# Patient Record
Sex: Female | Born: 1950 | Race: White | Hispanic: No | Marital: Married | Smoking: Never smoker
Health system: Southern US, Community
[De-identification: ages and names within clinical notes are randomized; demographics above are authoritative.]

## PROBLEM LIST (undated history)

## (undated) DIAGNOSIS — F329 Major depressive disorder, single episode, unspecified: Secondary | ICD-10-CM

## (undated) DIAGNOSIS — N904 Leukoplakia of vulva: Secondary | ICD-10-CM

## (undated) DIAGNOSIS — F0390 Unspecified dementia without behavioral disturbance: Secondary | ICD-10-CM

## (undated) DIAGNOSIS — F0283 Dementia in other diseases classified elsewhere, unspecified severity, with mood disturbance: Secondary | ICD-10-CM

## (undated) DIAGNOSIS — F0280 Dementia in other diseases classified elsewhere without behavioral disturbance: Secondary | ICD-10-CM

## (undated) DIAGNOSIS — G309 Alzheimer's disease, unspecified: Secondary | ICD-10-CM

## (undated) DIAGNOSIS — F32A Depression, unspecified: Secondary | ICD-10-CM

## (undated) DIAGNOSIS — G300 Alzheimer's disease with early onset: Secondary | ICD-10-CM

## (undated) DIAGNOSIS — E785 Hyperlipidemia, unspecified: Secondary | ICD-10-CM

## (undated) HISTORY — DX: Dementia in other diseases classified elsewhere without behavioral disturbance: F02.80

## (undated) HISTORY — DX: Depression, unspecified: F32.A

## (undated) HISTORY — DX: Leukoplakia of vulva: N90.4

## (undated) HISTORY — DX: Dementia in other diseases classified elsewhere, unspecified severity, with mood disturbance: F02.83

## (undated) HISTORY — DX: Hyperlipidemia, unspecified: E78.5

## (undated) HISTORY — DX: Alzheimer's disease with early onset: G30.0

## (undated) HISTORY — DX: Major depressive disorder, single episode, unspecified: F32.9

---

## 1996-02-11 HISTORY — PX: SALPINGECTOMY: SHX328

## 1996-02-11 HISTORY — PX: ABDOMINAL HYSTERECTOMY: SHX81

## 1999-05-09 ENCOUNTER — Other Ambulatory Visit: Admission: RE | Admit: 1999-05-09 | Discharge: 1999-05-09 | Payer: Self-pay | Admitting: *Deleted

## 2000-08-24 ENCOUNTER — Ambulatory Visit (HOSPITAL_COMMUNITY): Admission: RE | Admit: 2000-08-24 | Discharge: 2000-08-24 | Payer: Self-pay | Admitting: Gastroenterology

## 2000-09-09 LAB — HM COLONOSCOPY: HM Colonoscopy: NORMAL

## 2001-03-30 ENCOUNTER — Encounter: Payer: Self-pay | Admitting: Family Medicine

## 2001-03-30 ENCOUNTER — Encounter: Admission: RE | Admit: 2001-03-30 | Discharge: 2001-03-30 | Payer: Self-pay | Admitting: Family Medicine

## 2003-06-12 ENCOUNTER — Ambulatory Visit (HOSPITAL_COMMUNITY): Admission: RE | Admit: 2003-06-12 | Discharge: 2003-06-12 | Payer: Self-pay | Admitting: Family Medicine

## 2004-08-28 ENCOUNTER — Ambulatory Visit (HOSPITAL_BASED_OUTPATIENT_CLINIC_OR_DEPARTMENT_OTHER): Admission: RE | Admit: 2004-08-28 | Discharge: 2004-08-28 | Payer: Self-pay | Admitting: Family Medicine

## 2004-09-01 ENCOUNTER — Ambulatory Visit: Payer: Self-pay | Admitting: Internal Medicine

## 2006-08-28 ENCOUNTER — Ambulatory Visit: Payer: Self-pay | Admitting: Internal Medicine

## 2006-12-26 ENCOUNTER — Ambulatory Visit: Payer: Self-pay | Admitting: Ophthalmology

## 2008-02-11 HISTORY — PX: CYSTOCELE REPAIR: SHX163

## 2009-05-23 ENCOUNTER — Ambulatory Visit: Payer: Self-pay | Admitting: Internal Medicine

## 2010-08-07 ENCOUNTER — Ambulatory Visit: Payer: Self-pay | Admitting: Internal Medicine

## 2011-01-10 ENCOUNTER — Telehealth: Payer: Self-pay | Admitting: Internal Medicine

## 2011-01-10 MED ORDER — SUMATRIPTAN SUCCINATE 50 MG PO TABS: 50.0000 mg | ORAL_TABLET | Freq: Once | ORAL | Status: DC | PRN

## 2011-01-10 NOTE — Telephone Encounter (Signed)
Refill e-prescribed

## 2011-01-21 ENCOUNTER — Ambulatory Visit (INDEPENDENT_AMBULATORY_CARE_PROVIDER_SITE_OTHER): Payer: Self-pay | Admitting: Internal Medicine

## 2011-01-21 ENCOUNTER — Encounter: Payer: Self-pay | Admitting: Internal Medicine

## 2011-01-21 DIAGNOSIS — R5383 Other fatigue: Secondary | ICD-10-CM

## 2011-01-21 DIAGNOSIS — F0280 Dementia in other diseases classified elsewhere without behavioral disturbance: Secondary | ICD-10-CM

## 2011-01-21 DIAGNOSIS — IMO0001 Reserved for inherently not codable concepts without codable children: Secondary | ICD-10-CM

## 2011-01-21 DIAGNOSIS — Z79899 Other long term (current) drug therapy: Secondary | ICD-10-CM

## 2011-01-21 DIAGNOSIS — E785 Hyperlipidemia, unspecified: Secondary | ICD-10-CM

## 2011-01-21 DIAGNOSIS — R350 Frequency of micturition: Secondary | ICD-10-CM

## 2011-01-21 DIAGNOSIS — R5381 Other malaise: Secondary | ICD-10-CM

## 2011-01-21 DIAGNOSIS — G43909 Migraine, unspecified, not intractable, without status migrainosus: Secondary | ICD-10-CM

## 2011-01-21 DIAGNOSIS — F329 Major depressive disorder, single episode, unspecified: Secondary | ICD-10-CM

## 2011-01-21 DIAGNOSIS — F0390 Unspecified dementia without behavioral disturbance: Secondary | ICD-10-CM

## 2011-01-21 LAB — POCT URINALYSIS DIPSTICK
Glucose, UA: NEGATIVE
Nitrite, UA: NEGATIVE
Urobilinogen, UA: 0.2

## 2011-01-21 MED ORDER — SERTRALINE HCL 100 MG PO TABS: 100.0000 mg | ORAL_TABLET | Freq: Every day | ORAL | Status: DC

## 2011-01-22 ENCOUNTER — Encounter: Payer: Self-pay | Admitting: Internal Medicine

## 2011-01-22 DIAGNOSIS — F329 Major depressive disorder, single episode, unspecified: Secondary | ICD-10-CM | POA: Insufficient documentation

## 2011-01-22 DIAGNOSIS — E785 Hyperlipidemia, unspecified: Secondary | ICD-10-CM | POA: Insufficient documentation

## 2011-01-22 DIAGNOSIS — G43909 Migraine, unspecified, not intractable, without status migrainosus: Secondary | ICD-10-CM | POA: Insufficient documentation

## 2011-01-22 LAB — COMPREHENSIVE METABOLIC PANEL
ALT: 32 U/L (ref 0–35)
CO2: 26 mEq/L (ref 19–32)
Creatinine, Ser: 0.7 mg/dL (ref 0.4–1.2)
GFR: 96.01 mL/min (ref >60.00)
Total Bilirubin: 0.5 mg/dL (ref 0.3–1.2)

## 2011-01-22 LAB — TSH: TSH: 1.05 u[IU]/mL (ref 0.35–5.50)

## 2011-01-22 NOTE — Assessment & Plan Note (Signed)
Recent recurrence of weekly headaches  Reported, although mild.  Will increase sertraline dose to 100 mg for prevention since historically her headaches improved with SSRI therapy. Continue sumatriptan for management of acute headaches.

## 2011-01-22 NOTE — Assessment & Plan Note (Signed)
With initial diagnosis confirmed by Shannan Harper at [facility].  Toady's MMSE was notable for losses in recall at 1 minute, abstract deficits including clock drawing and picture drawing.  Per husband, she is not allowed to cook or manage any finances at home .  He has hired a Community education officer several days per week to spend 2-3 hours with her during the day.

## 2011-01-22 NOTE — Progress Notes (Signed)
Subjective:    Patient ID: Lorraine Andrade, female    DOB: 05/11/1950, 60 y.o.   MRN: [id number]  HPI  Ms . 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  Her husband Doreatha Martin has taken over all of the financial matters she she was previously able to handle.  Has noted some urinary incontinence of the urge type.  No behavioral disturbances but increasing congfion has led him to relieve her of any cooking as well.  She remains able to drive to the grocery store and has had no episodes of getting lost or of traffic accidents. Recent recurrence of migraine headaches over the past 2 months also reported.   Past Medical History  Diagnosis Date  . Hyperlipidemia   . Depression   . Migraine   . Dementia of Alzheimer's type, with early onset, with depressed mood    Current Outpatient Prescriptions on File Prior to Visit  Medication Sig Dispense Refill  . SUMAtriptan (IMITREX) 50 MG tablet Take 1 tablet (50 mg total) by mouth once as needed for migraine.  30 tablet  2    Review of Systems  Constitutional: Negative for fever, chills and unexpected weight change.  HENT: Negative for hearing loss, ear pain, nosebleeds, congestion, sore throat, facial swelling, rhinorrhea, sneezing, mouth sores, trouble swallowing, neck pain, neck stiffness, voice change, postnasal drip, sinus pressure, tinnitus and ear discharge.   Eyes: Negative for pain, discharge, redness and visual disturbance.  Respiratory: Negative for cough, chest tightness, shortness of breath, wheezing and stridor.   Cardiovascular: Negative for chest pain, palpitations and leg swelling.  Genitourinary: Positive for urgency.  Musculoskeletal: Negative for myalgias and arthralgias.  Skin: Negative for color change and rash.  Neurological: Positive for headaches. Negative for dizziness, weakness and light-headedness.  Hematological: Negative for adenopathy.   Psychiatric/Behavioral: Positive for confusion.   BP 130/72  Pulse 76  Temp(Src) 98.1 F (36.7 C) (Oral)  Resp 16  Ht 5\' 5"  (1.651 m)  Wt 167 lb 8 oz (75.978 kg)  BMI 27.87 kg/m2  SpO2 96%     Objective:   Physical Exam  Constitutional: She is oriented to person, place, and time. She appears well-developed and well-nourished.  HENT:  Mouth/Throat: Oropharynx is clear and moist.  Eyes: EOM are normal. Pupils are equal, round, and reactive to light. No scleral icterus.  Neck: Normal range of motion. Neck supple. No JVD present. No thyromegaly present.  Cardiovascular: Normal rate, regular rhythm, normal heart sounds and intact distal pulses.   Pulmonary/Chest: Effort normal and breath sounds normal.  Abdominal: Soft. Bowel sounds are normal. She exhibits no mass. There is no tenderness.  Musculoskeletal: Normal range of motion. She exhibits no edema.  Lymphadenopathy:    She has no cervical adenopathy.  Neurological: She is alert and oriented to person, place, and time.  Skin: Skin is warm and dry.  Psychiatric: She has a normal mood and affect.          Assessment & Plan:   Dementia of Alzheimer's type, with early onset, with depressed mood With initial diagnosis confirmed by Shannan Harper at [facility].  Toady's MMSE was notable for losses in recall at 1 minute, abstract deficits including clock drawing and picture drawing.  Per husband, she is not allowed to cook or manage any finances at home .  He has hired a Community education officer several days per week to spend 2-3  hours with her during the day.  Hyperlipidemia LDL goal <100 Managed with simvastatin, last evaluated one year ago.  She is overdue  for fasting lipids and CMET adn advised husband to resume q 6 months hepatic panel testing.   Migraine headache Recent recurrence of weekly headaches  Reported, although mild.  Will increase sertraline dose to 100 mg for prevention since historically her headaches improved with SSRI  therapy. Continue sumatriptan for management of acute headaches.     Updated Medication List Outpatient Encounter Prescriptions as of 01/21/2011  Medication Sig Dispense Refill  . sertraline (ZOLOFT) 100 MG tablet Take 1 tablet (100 mg total) by mouth daily.  90 tablet  3  . SUMAtriptan (IMITREX) 50 MG tablet Take 1 tablet (50 mg total) by mouth once as needed for migraine.  30 tablet  2  . DISCONTD: sertraline (ZOLOFT) 50 MG tablet Take 50 mg by mouth daily.        Marland Kitchen DISCONTD: ALPRAZolam (XANAX) 0.5 MG tablet Take 0.5 mg by mouth at bedtime as needed.        Marland Kitchen DISCONTD: SERTRALINE HCL PO Take by mouth.        . DISCONTD: simvastatin (ZOCOR) 40 MG tablet Take 40 mg by mouth at bedtime.

## 2011-01-22 NOTE — Assessment & Plan Note (Addendum)
Managed with simvastatin, last evaluated one year ago.  She is overdue  for fasting lipids and CMET adn advised husband to resume q 6 months hepatic panel testing.

## 2011-01-23 LAB — CULTURE, URINE COMPREHENSIVE
Colony Count: NO GROWTH
Organism ID, Bacteria: NO GROWTH

## 2011-02-10 ENCOUNTER — Other Ambulatory Visit: Payer: Self-pay | Admitting: Internal Medicine

## 2011-04-15 ENCOUNTER — Encounter: Payer: Self-pay | Admitting: Internal Medicine

## 2011-05-19 ENCOUNTER — Encounter: Payer: Self-pay | Admitting: Internal Medicine

## 2011-05-19 ENCOUNTER — Ambulatory Visit (INDEPENDENT_AMBULATORY_CARE_PROVIDER_SITE_OTHER): Payer: No Typology Code available for payment source | Admitting: Internal Medicine

## 2011-05-19 VITALS — BP 128/76 | HR 74 | Temp 98.6°F | Resp 16 | Ht 68.0 in | Wt 168.0 lb

## 2011-05-19 DIAGNOSIS — E785 Hyperlipidemia, unspecified: Secondary | ICD-10-CM

## 2011-05-19 DIAGNOSIS — Z13220 Encounter for screening for lipoid disorders: Secondary | ICD-10-CM

## 2011-05-19 DIAGNOSIS — R5381 Other malaise: Secondary | ICD-10-CM

## 2011-05-19 DIAGNOSIS — F0280 Dementia in other diseases classified elsewhere without behavioral disturbance: Secondary | ICD-10-CM

## 2011-05-19 DIAGNOSIS — Z1211 Encounter for screening for malignant neoplasm of colon: Secondary | ICD-10-CM | POA: Insufficient documentation

## 2011-05-19 DIAGNOSIS — Z0000 Encounter for general adult medical examination without abnormal findings: Secondary | ICD-10-CM

## 2011-05-19 DIAGNOSIS — R5383 Other fatigue: Secondary | ICD-10-CM

## 2011-05-19 DIAGNOSIS — F329 Major depressive disorder, single episode, unspecified: Secondary | ICD-10-CM

## 2011-05-19 DIAGNOSIS — Z1239 Encounter for other screening for malignant neoplasm of breast: Secondary | ICD-10-CM

## 2011-05-19 NOTE — Patient Instructions (Signed)
Your exam was normal.  We will get your mamogram set up .  Return for fasting labs when it is convenient. For you to do so.

## 2011-05-19 NOTE — Assessment & Plan Note (Signed)
Progressive,  Remains verbally communicative,  But all executive tasks have been taken over by husband and caregiver.

## 2011-05-19 NOTE — Progress Notes (Addendum)
Patient ID: Lorraine Andrade, female   DOB: January 25, 1951, 61 y.o.   MRN: [id number]   Patient Active Problem List  Diagnoses  . Dementia of Alzheimer's type, with early onset, with depressed mood  . Hyperlipidemia LDL goal <100  . Migraine headache  . Annual physical exam  . Screening for colon cancer    Subjective:  CC:   Chief Complaint  Patient presents with  . Annual Exam    HPI:   Lorraine Andrade a 61 y.o. female who presents For her annual exam.  She has a history of early progressive dementia, hyperlipidemia and lichen sclerosis et atrophicus.  History of cystocele repaired several years ago at [facility].  continues to have intermittent mild stress urinary incontinence, managed with use of a pad.  Sleeping well, no joint pains. Remains active at home but all executive functions are now managed by husband and caregiver.  S/p hysterectomy years ago for noncancerous reasons.    Past Medical History  Diagnosis Date  . Hyperlipidemia   . Depression   . Migraine   . Dementia of Alzheimer's type, with early onset, with depressed mood     Past Surgical History  Procedure Date  . Salpingectomy 1998    bilateral  . Abdominal hysterectomy 1998  . Cystocele repair 2010    [facility]         The following portions of the patient's history were reviewed and updated as appropriate: Allergies, current medications, and problem list.    Review of Systems:   12 Pt  review of systems was negative except those addressed in the HPI,     History   Social History  . Marital Status: Married    Spouse Name: N/A    Number of Children: N/A  . Years of Education: N/A   Occupational History  . Not on file.   Social History Main Topics  . Smoking status: Never Smoker   . Smokeless tobacco: Never Used  . Alcohol Use: No  . Drug Use: No  . Sexually Active: Not on file   Other Topics Concern  . Not on file   Social History Narrative  . No narrative on file     Objective:  BP 128/76  Pulse 74  Temp(Src) 98.6 F (37 C) (Oral)  Resp 16  Ht 5\' 8"  (1.727 m)  Wt 168 lb (76.204 kg)  BMI 25.54 kg/m2  SpO2 98%  General appearance: alert, cooperative and appears stated age Ears: normal TM's and external ear canals both ears Throat: lips, mucosa, and tongue normal; teeth and gums normal Neck: no adenopathy, no carotid bruit, supple, symmetrical, trachea midline and thyroid not enlarged, symmetric, no tenderness/mass/nodules Back: symmetric, no curvature. ROM normal. No CVA tenderness. Lungs: clear to auscultation bilaterally Heart: regular rate and rhythm, S1, S2 normal, no murmur, click, rub or gallop Abdomen: soft, non-tender; bowel sounds normal; no masses,  no organomegaly Pulses: 2+ and symmetric Skin: Skin color, texture, turgor normal. No rashes or lesions Lymph nodes: Cervical, supraclavicular, and axillary nodes normal.  Assessment and Plan:  Screening for colon cancer Her last colonoscopy was done by [facility], 2003.  Diverticulosis reported. Will repeat with [facility] unless patient has preference.   Dementia of Alzheimer's type, with early onset, with depressed mood Progressive,  Remains verbally communicative,  But all executive tasks have been taken over by husband and caregiver.   Hyperlipidemia LDL goal <100 Managed with low dose pravastatin,  Due for repeat lipids and  CMET   Annual physical exam Breast and pelvic done today.  Mammogram ordered. She is s/p hysterectomy and repair of cystocele.     Updated Medication List Outpatient Encounter Prescriptions as of 05/19/2011  Medication Sig Dispense Refill  . sertraline (ZOLOFT) 100 MG tablet Take 1 tablet (100 mg total) by mouth daily.  90 tablet  3  . simvastatin (ZOCOR) 40 MG tablet Take 40 mg by mouth every evening.      . SUMAtriptan (IMITREX) 50 MG tablet Take 1 tablet (50 mg total) by mouth once as needed for migraine.  30 tablet  2     Orders Placed This  Encounter  Procedures  . MM Digital Screening  . TSH  . Lipid panel  . COMPLETE METABOLIC PANEL WITH GFR    No Follow-up on file.

## 2011-05-19 NOTE — Assessment & Plan Note (Signed)
Breast and pelvic done today.  Mammogram ordered. She is s/p hysterectomy and repair of cystocele.

## 2011-05-19 NOTE — Assessment & Plan Note (Signed)
Managed with low dose pravastatin,  Due for repeat lipids and CMET

## 2011-05-19 NOTE — Assessment & Plan Note (Addendum)
Her last colonoscopy was done by [facility], 2003.  Diverticulosis reported. Will repeat with  GI unless patient has preference.

## 2011-07-11 ENCOUNTER — Encounter: Payer: Self-pay | Admitting: Internal Medicine

## 2011-07-11 ENCOUNTER — Ambulatory Visit (INDEPENDENT_AMBULATORY_CARE_PROVIDER_SITE_OTHER): Payer: No Typology Code available for payment source | Admitting: Internal Medicine

## 2011-07-11 VITALS — BP 118/70 | HR 67 | Temp 98.1°F | Resp 16 | Wt 168.0 lb

## 2011-07-11 DIAGNOSIS — R5381 Other malaise: Secondary | ICD-10-CM

## 2011-07-11 DIAGNOSIS — R102 Pelvic and perineal pain: Secondary | ICD-10-CM

## 2011-07-11 DIAGNOSIS — Z13220 Encounter for screening for lipoid disorders: Secondary | ICD-10-CM

## 2011-07-11 DIAGNOSIS — Z1239 Encounter for other screening for malignant neoplasm of breast: Secondary | ICD-10-CM

## 2011-07-11 DIAGNOSIS — E785 Hyperlipidemia, unspecified: Secondary | ICD-10-CM

## 2011-07-11 DIAGNOSIS — R5383 Other fatigue: Secondary | ICD-10-CM

## 2011-07-11 DIAGNOSIS — N949 Unspecified condition associated with female genital organs and menstrual cycle: Secondary | ICD-10-CM

## 2011-07-11 DIAGNOSIS — Z1211 Encounter for screening for malignant neoplasm of colon: Secondary | ICD-10-CM

## 2011-07-11 LAB — COMPREHENSIVE METABOLIC PANEL
ALT: 23 U/L (ref 0–35)
AST: 21 U/L (ref 0–37)
Alkaline Phosphatase: 66 U/L (ref 39–117)
Calcium: 9.1 mg/dL (ref 8.4–10.5)
Chloride: 105 mEq/L (ref 96–112)
Creatinine, Ser: 0.7 mg/dL (ref 0.4–1.2)

## 2011-07-11 LAB — TSH: TSH: 0.79 u[IU]/mL (ref 0.35–5.50)

## 2011-07-11 LAB — POCT URINALYSIS DIPSTICK
Bilirubin, UA: NEGATIVE
Glucose, UA: NEGATIVE
Ketones, UA: NEGATIVE
Nitrite, UA: NEGATIVE
Protein, UA: NEGATIVE
Spec Grav, UA: 1.025
Urobilinogen, UA: 0.2
pH, UA: 7

## 2011-07-11 LAB — LIPID PANEL
LDL Cholesterol: 94 mg/dL (ref 0–99)
Total CHOL/HDL Ratio: 4
Triglycerides: 169 mg/dL — ABNORMAL HIGH (ref 0.0–149.0)
VLDL: 33.8 mg/dL (ref 0.0–40.0)

## 2011-07-11 MED ORDER — CLOBETASOL PROPIONATE 0.05 % EX OINT: 1.0000 "application " | TOPICAL_OINTMENT | Freq: Two times a day (BID) | CUTANEOUS | Status: DC

## 2011-07-11 NOTE — Progress Notes (Addendum)
Patient ID: Lorraine Andrade, female   DOB: [birth year], 61 y.o.   MRN: [id number]  Patient Active Problem List  Diagnoses  . Dementia of Alzheimer's type, with early onset, with depressed mood  . Hyperlipidemia LDL goal <100  . Migraine headache  . Annual physical exam  . Screening for colon cancer  . Suprapubic pain, acute    Subjective:  CC:   Chief Complaint  Patient presents with  . Pelvic Pain    HPI:   Lorraine Hays Mooreis a 61 y.o. female who presents after a recent episode of severe lower abdominal pain accompanied by mild frequency, occurred while vacationing at their home in [place] [facility].  No diarrhea or hematuria experienced.  Because of the dementia,  Etiology (bowel vs bladder ) was unclear.  Took tylenol and suppisotory for hemorrhoids and after a few days felt better.    Past Medical History  Diagnosis Date  . Hyperlipidemia   . Depression   . Migraine   . Dementia of Alzheimer's type, with early onset, with depressed mood     Past Surgical History  Procedure Date  . Salpingectomy 1998    bilateral  . Abdominal hysterectomy 1998  . Cystocele repair 2010    [facility]         The following portions of the patient's history were reviewed and updated as appropriate: Allergies, current medications, and problem list.    Review of Systems:  Comprehensive review of systems was negative except those addressed in the HPI,     History   Social History  . Marital Status: Married    Spouse Name: N/A    Number of Children: N/A  . Years of Education: N/A   Occupational History  . Not on file.   Social History Main Topics  . Smoking status: Never Smoker   . Smokeless tobacco: Never Used  . Alcohol Use: No  . Drug Use: No  . Sexually Active: Not on file   Other Topics Concern  . Not on file   Social History Narrative  . No narrative on file    Objective:  BP 118/70  Pulse 67  Temp(Src) 98.1 F (36.7 C) (Oral)  Resp 16  Wt 168 lb  (76.204 kg)  SpO2 93%  General appearance: alert, cooperative and appears stated age Ears: normal TM's and external ear canals both ears Throat: lips, mucosa, and tongue normal; teeth and gums normal Neck: no adenopathy, no carotid bruit, supple, symmetrical, trachea midline and thyroid not enlarged, symmetric, no tenderness/mass/nodules Back: symmetric, no curvature. ROM normal. No CVA tenderness. Lungs: clear to auscultation bilaterally Heart: regular rate and rhythm, S1, S2 normal, no murmur, click, rub or gallop Abdomen: soft, non-tender; bowel sounds normal; no masses,  no organomegaly GYN: vaginal atrophy noted no discharge or irritation noted. adnexa nonpalpable Rectal: no masses, stool brown and heme negative.  Pulses: 2+ and symmetric Skin: Skin color, texture, turgor normal. No rashes or lesions Lymph nodes: Cervical, supraclavicular, and axillary nodes normal.  Assessment and Plan:  Suprapubic pain, acute Pelvic and rectal exam were done today with no evidence of abnormality on exam.  UA had trace amount of leukocytes,  Will treat based on culture results.   Hyperlipidemia LDL goal <100 LDl is 94 on current statin dose ,  And LFTs are normal.  No changes to regimen.     Updated Medication List Outpatient Encounter Prescriptions as of 07/11/2011  Medication Sig Dispense Refill  . clobetasol ointment (TEMOVATE) 0.05 %  Apply 1 application topically 2 (two) times daily.  30 g  5  . sertraline (ZOLOFT) 100 MG tablet Take 1 tablet (100 mg total) by mouth daily.  90 tablet  3  . simvastatin (ZOCOR) 40 MG tablet Take 40 mg by mouth every evening.      . SUMAtriptan (IMITREX) 50 MG tablet Take 1 tablet (50 mg total) by mouth once as needed for migraine.  30 tablet  2  . DISCONTD: clobetasol ointment (TEMOVATE) 0.05 % Apply 1 application topically 2 (two) times daily.         Orders Placed This Encounter  Procedures  . Urine Culture  . HM MAMMOGRAPHY  . MM Digital Screening    . TSH  . Lipid panel  . Comp Met (CMET)  . Ambulatory referral to Gastroenterology  . POCT Urinalysis Dipstick  . IFOBT POC (occult bld, rslt in office)  . HM COLONOSCOPY  . HM COLONOSCOPY    No Follow-up on file.

## 2011-07-13 DIAGNOSIS — R102 Pelvic and perineal pain: Secondary | ICD-10-CM | POA: Insufficient documentation

## 2011-07-13 NOTE — Assessment & Plan Note (Addendum)
Etiology unclear, Pelvic and rectal exam were done today with no evidence of abnormality on exam.  UA had trace amount of leukocytes,  Will treat based on culture results.

## 2011-07-13 NOTE — Assessment & Plan Note (Signed)
LDl is 94 on current statin dose ,  And LFTs are normal.  No changes to regimen.

## 2011-07-14 LAB — URINE CULTURE: Colony Count: 80000

## 2011-08-15 NOTE — Progress Notes (Signed)
Patient ID: Lorraine Andrade, female   DOB: 10-Apr-1950, 61 y.o.   MRN: [id number]

## 2011-08-27 ENCOUNTER — Ambulatory Visit: Payer: Self-pay | Admitting: Internal Medicine

## 2011-09-01 ENCOUNTER — Telehealth: Payer: Self-pay | Admitting: Internal Medicine

## 2011-09-01 ENCOUNTER — Other Ambulatory Visit: Payer: Self-pay | Admitting: *Deleted

## 2011-09-01 MED ORDER — SIMVASTATIN 40 MG PO TABS: 40.0000 mg | ORAL_TABLET | Freq: Every evening | ORAL | Status: DC

## 2011-09-01 NOTE — Telephone Encounter (Signed)
Patient notified

## 2011-09-01 NOTE — Telephone Encounter (Signed)
Her mammogram was normal.  Repeat in one year 

## 2011-09-02 ENCOUNTER — Other Ambulatory Visit: Payer: Self-pay | Admitting: Internal Medicine

## 2011-09-02 MED ORDER — SERTRALINE HCL 100 MG PO TABS: 100.0000 mg | ORAL_TABLET | Freq: Every day | ORAL | Status: DC

## 2011-11-26 ENCOUNTER — Encounter: Payer: Self-pay | Admitting: Internal Medicine

## 2011-11-26 ENCOUNTER — Ambulatory Visit (INDEPENDENT_AMBULATORY_CARE_PROVIDER_SITE_OTHER): Payer: Medicare Other | Admitting: Internal Medicine

## 2011-11-26 VITALS — BP 132/78 | HR 77 | Temp 98.2°F | Ht 68.0 in | Wt 174.8 lb

## 2011-11-26 DIAGNOSIS — N904 Leukoplakia of vulva: Secondary | ICD-10-CM

## 2011-11-26 DIAGNOSIS — Z23 Encounter for immunization: Secondary | ICD-10-CM

## 2011-11-26 DIAGNOSIS — L940 Localized scleroderma [morphea]: Secondary | ICD-10-CM

## 2011-11-26 DIAGNOSIS — R309 Painful micturition, unspecified: Secondary | ICD-10-CM

## 2011-11-26 DIAGNOSIS — N23 Unspecified renal colic: Secondary | ICD-10-CM

## 2011-11-26 LAB — POCT URINALYSIS DIPSTICK
Bilirubin, UA: NEGATIVE
Glucose, UA: NEGATIVE
Ketones, UA: NEGATIVE
Leukocytes, UA: NEGATIVE
Protein, UA: NEGATIVE
Spec Grav, UA: 1.025

## 2011-11-26 MED ORDER — MEMANTINE HCL ER 28 MG PO CP24: 1.0000 | ORAL_CAPSULE | Freq: Every day | ORAL | Status: DC

## 2011-11-26 NOTE — Patient Instructions (Signed)
Please use the clobetasol ointment EVERY NIGHT for 6 weeks.  Return in 6 weeks for a recheck to see if we can start tapering your dose.

## 2011-11-26 NOTE — Progress Notes (Signed)
Patient ID: Lorraine Andrade, female   DOB: 12/16/1950, 61 y.o.   MRN: [id number]  Patient Active Problem List  Diagnosis  . Dementia of Alzheimer's type, with early onset, with depressed mood  . Hyperlipidemia LDL goal <100  . Migraine headache  . Annual physical exam  . Screening for colon cancer  . Lichen sclerosus of female genitalia    Subjective:  CC:   Chief Complaint  Patient presents with  . Diaper Rash    HPI:   Lorraine Andrade a 61 y.o. female who presents Dysuria. Symptoms have been present for one week.  She denies back pain, uriary frequency.  She has a  History of lichen sclerosis et atrophicans but has not  been using her steroid ointment clobetasol proprionate in several months.      Past Medical History  Diagnosis Date  . Hyperlipidemia   . Depression   . Migraine   . Dementia of Alzheimer's type, with early onset, with depressed mood   . Lichen sclerosus of female genitalia     Past Surgical History  Procedure Date  . Salpingectomy 1998    bilateral  . Abdominal hysterectomy 1998  . Cystocele repair 2010    [facility]         The following portions of the patient's history were reviewed and updated as appropriate: Allergies, current medications, and problem list.    Review of Systems:   12 Pt  review of systems was negative except those addressed in the HPI,     History   Social History  . Marital Status: Married    Spouse Name: N/A    Number of Children: N/A  . Years of Education: N/A   Occupational History  . Not on file.   Social History Main Topics  . Smoking status: Never Smoker   . Smokeless tobacco: Never Used  . Alcohol Use: No  . Drug Use: No  . Sexually Active: Not on file   Other Topics Concern  . Not on file   Social History Narrative  . No narrative on file    Objective:  BP 132/78  Pulse 77  Temp 98.2 F (36.8 C) (Oral)  Ht 5\' 8"  (1.727 m)  Wt 174 lb 12 oz (79.266 kg)  BMI 26.57 kg/m2  SpO2  97%  General appearance: alert, cooperative and appears stated age Neck: no adenopathy, no carotid bruit, supple, symmetrical, trachea midline and thyroid not enlarged, symmetric, no tenderness/mass/nodules Back: symmetric, no curvature. ROM normal. No CVA tenderness. Lungs: clear to auscultation bilaterally Heart: regular rate and rhythm, S1, S2 normal, no murmur, click, rub or gallop Abdomen: soft, non-tender; bowel sounds normal; no masses,  no organomegaly GYN: vulva mildly erythematous, vaginal vulva without discharge or erythema  Pulses: 2+ and symmetric Skin: Skin color,  turgor normal. . No rashes or lesions. Texture extremely dry/xerotic Lymph nodes: Cervical, supraclavicular, and axillary nodes normal.  Assessment and Plan:  Lichen sclerosus of female genitalia With current recurrence of symptoms due to lapse in treatment.  Resume use of ointment nightly for 6 weeks, return for repeat evaluation.    Updated Medication List Outpatient Encounter Prescriptions as of 11/26/2011  Medication Sig Dispense Refill  . clobetasol ointment (TEMOVATE) 0.05 % Apply 1 application topically 2 (two) times daily.  30 g  5  . sertraline (ZOLOFT) 100 MG tablet Take 1 tablet (100 mg total) by mouth daily.  90 tablet  3  . simvastatin (ZOCOR) 40 MG tablet Take  1 tablet (40 mg total) by mouth every evening.  30 tablet  4  . DISCONTD: Memantine HCl ER (NAMENDA XR TITRATION PACK) 7 & 14 & 21 &28 MG CP24 Take by mouth.      . Memantine HCl ER (NAMENDA XR) 28 MG CP24 Take 1 tablet by mouth daily.  30 capsule  11  . SUMAtriptan (IMITREX) 50 MG tablet Take 1 tablet (50 mg total) by mouth once as needed for migraine.  30 tablet  2     Orders Placed This Encounter  Procedures  . Flu vaccine greater than or equal to 3yo preservative free IM  . POCT urinalysis dipstick    Return in about 6 weeks (around 01/07/2012).

## 2011-11-28 ENCOUNTER — Encounter: Payer: Self-pay | Admitting: Internal Medicine

## 2011-11-28 DIAGNOSIS — N904 Leukoplakia of vulva: Secondary | ICD-10-CM | POA: Insufficient documentation

## 2011-11-28 NOTE — Assessment & Plan Note (Signed)
With current recurrence of symptoms due to lapse in treatment.  Resume use of ointment nightly for 6 weeks, return for repeat evaluation.

## 2011-11-29 MED ORDER — CLOBETASOL PROPIONATE 0.05 % EX OINT: 1.0000 "application " | TOPICAL_OINTMENT | Freq: Every day | CUTANEOUS | Status: DC

## 2011-12-29 ENCOUNTER — Other Ambulatory Visit: Payer: Self-pay | Admitting: Internal Medicine

## 2011-12-29 DIAGNOSIS — F0280 Dementia in other diseases classified elsewhere without behavioral disturbance: Secondary | ICD-10-CM

## 2011-12-29 MED ORDER — RIVASTIGMINE 4.6 MG/24HR TD PT24: 1.0000 | MEDICATED_PATCH | Freq: Every day | TRANSDERMAL | Status: DC

## 2012-01-05 ENCOUNTER — Ambulatory Visit (INDEPENDENT_AMBULATORY_CARE_PROVIDER_SITE_OTHER): Payer: Medicare Other | Admitting: Internal Medicine

## 2012-01-05 ENCOUNTER — Encounter: Payer: Self-pay | Admitting: Internal Medicine

## 2012-01-05 VITALS — BP 126/70 | HR 73 | Temp 98.2°F | Resp 12 | Ht 68.0 in | Wt 177.0 lb

## 2012-01-05 DIAGNOSIS — L940 Localized scleroderma [morphea]: Secondary | ICD-10-CM

## 2012-01-05 DIAGNOSIS — Z1211 Encounter for screening for malignant neoplasm of colon: Secondary | ICD-10-CM

## 2012-01-05 DIAGNOSIS — E785 Hyperlipidemia, unspecified: Secondary | ICD-10-CM

## 2012-01-05 DIAGNOSIS — F329 Major depressive disorder, single episode, unspecified: Secondary | ICD-10-CM

## 2012-01-05 DIAGNOSIS — F0280 Dementia in other diseases classified elsewhere without behavioral disturbance: Secondary | ICD-10-CM

## 2012-01-05 DIAGNOSIS — N904 Leukoplakia of vulva: Secondary | ICD-10-CM

## 2012-01-05 NOTE — Patient Instructions (Addendum)
Continue to use the cream 3 times weekly (every other night)   You need to drink at least 3 glasses of water (flavored ok) daily

## 2012-01-06 ENCOUNTER — Encounter: Payer: Self-pay | Admitting: Internal Medicine

## 2012-01-06 NOTE — Assessment & Plan Note (Signed)
Symptoms are now resolved with daily use of clobetasol ointment. Caregiver has been instructed to reduce use 2-3 times weekly now for S. maintenance

## 2012-01-06 NOTE — Progress Notes (Signed)
Patient ID: Lorraine Andrade, female   DOB: 12-01-1950, 61 y.o.   MRN: [id number]  Patient Active Problem List  Diagnosis  . Dementia of Alzheimer's type, with early onset, with depressed mood  . Hyperlipidemia LDL goal <100  . Migraine headache  . Annual physical exam  . Screening for colon cancer  . Lichen sclerosus of female genitalia    Subjective:  CC:   Chief Complaint  Patient presents with  . Follow-up    HPI:   Lorraine Andrade a 61 y.o. female who presents Six-week followup on dementia and chronic vaginal itching secondary to lapse in medication for lichen sclerosis et atrophicus.  She is accompanied by her caregiver/family member. She is accompanied and supervised by caregivers at all times while her husband is at work. She states that her previous symptoms of itching have resolved. She is not sure whether she is using the vaginal cream on a daily basis. Her caregiver is not sure either substances managed by her husband. There have been recent emotional stressors including the death of I. family member from liver cancer. Patient has been somewhat distracted by that. She has started Exelon patch within the last 48 hours as adjunctive treatment for Alzheimer's dementia. She has been tolerating the Namenda without problems. The caregiver is requesting information about any possible adverse effects to expect from the Exelon. Printed  handout was given.   Past Medical History  Diagnosis Date  . Hyperlipidemia   . Depression   . Migraine   . Dementia of Alzheimer's type, with early onset, with depressed mood   . Lichen sclerosus of female genitalia     Past Surgical History  Procedure Date  . Salpingectomy 1998    bilateral  . Abdominal hysterectomy 1998  . Cystocele repair 2010    [facility]   The following portions of the patient's history were reviewed and updated as appropriate: Allergies, current medications, and problem list.   Review of Systems:   Patient  denies headache, fevers, malaise, unintentional weight loss, skin rash, eye pain, sinus congestion and sinus pain, sore throat, dysphagia,  hemoptysis , cough, dyspnea, wheezing, chest pain, palpitations, orthopnea, edema, abdominal pain, nausea, melena, diarrhea, constipation, flank pain, dysuria, hematuria, urinary  Frequency, nocturia, numbness, tingling, seizures,  Focal weakness, Loss of consciousness,  Tremor, insomnia, depression, anxiety, and suicidal ideation.        History   Social History  . Marital Status: Married    Spouse Name: N/A    Number of Children: N/A  . Years of Education: N/A   Occupational History  . Not on file.   Social History Main Topics  . Smoking status: Never Smoker   . Smokeless tobacco: Never Used  . Alcohol Use: No  . Drug Use: No  . Sexually Active: Not on file   Other Topics Concern  . Not on file   Social History Narrative  . No narrative on file    Objective:  BP 126/70  Pulse 73  Temp 98.2 F (36.8 C) (Oral)  Resp 12  Ht 5\' 8"  (1.727 m)  Wt 177 lb (80.287 kg)  BMI 26.91 kg/m2  SpO2 97%  General appearance: alert, cooperative and appears stated age Ears: normal TM's and external ear canals both ears Throat: lips, mucosa, and tongue normal; teeth and gums normal Neck: no adenopathy, no carotid bruit, supple, symmetrical, trachea midline and thyroid not enlarged, symmetric, no tenderness/mass/nodules Back: symmetric, no curvature. ROM normal. No CVA tenderness. Lungs:  clear to auscultation bilaterally Heart: regular rate and rhythm, S1, S2 normal, no murmur, click, rub or gallop Abdomen: soft, non-tender; bowel sounds normal; no masses,  no organomegaly Pulses: 2+ and symmetric Skin: Skin color, texture, turgor normal. No rashes or lesions Lymph nodes: Cervical, supraclavicular, and axillary nodes normal.  Assessment and Plan:  Dementia of Alzheimer's type, with early onset, with depressed mood Symptoms are stable and she  is tolerating Namenda well. Exelon patch was started yesterday. Reviewed any possible adverse effects with caregiver. Due to loss of executive function she is supervised during the day by caregivers until her husband is home.  Hyperlipidemia LDL goal <100 Liver function tests were normal in June and LDL is at goal on his current simvastatin dose. No changes today.  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 Caregiver has been instructed to reduce use 2-3 times weekly now for S. maintenance  Screening for colon cancer Her last colonoscopy was in 2003 by Dr. Randa Evens in [place]. Will discuss with husband but he wants to continue screening.   Updated Medication List Outpatient Encounter Prescriptions as of 01/05/2012  Medication Sig Dispense Refill  . clobetasol ointment (TEMOVATE) 0.05 % Apply 1 application topically at bedtime.  30 g  5  . Memantine HCl ER (NAMENDA XR) 28 MG CP24 Take 1 tablet by mouth daily.  30 capsule  11  . rivastigmine (EXELON) 4.6 mg/24hr Place 1 patch (4.6 mg total) onto the skin daily.  30 patch  3  . sertraline (ZOLOFT) 100 MG tablet Take 1 tablet (100 mg total) by mouth daily.  90 tablet  3  . simvastatin (ZOCOR) 40 MG tablet Take 1 tablet (40 mg total) by mouth every evening.  30 tablet  4  . SUMAtriptan (IMITREX) 50 MG tablet Take 1 tablet (50 mg total) by mouth once as needed for migraine.  30 tablet  2     No orders of the defined types were placed in this encounter.    No Follow-up on file.

## 2012-01-06 NOTE — Assessment & Plan Note (Addendum)
Symptoms are stable and she is tolerating Namenda well. Exelon patch was started yesterday. Reviewed any possible adverse effects with caregiver. Due to loss of executive function she is supervised during the day by caregivers until her husband is home.

## 2012-01-06 NOTE — Assessment & Plan Note (Signed)
Her last colonoscopy was in 2003 by Dr. Randa Evens in [place]. Will discuss with husband but he wants to continue screening.

## 2012-01-06 NOTE — Assessment & Plan Note (Signed)
Liver function tests were normal in June and LDL is at goal on his current simvastatin dose. No changes today.

## 2012-03-19 ENCOUNTER — Ambulatory Visit (INDEPENDENT_AMBULATORY_CARE_PROVIDER_SITE_OTHER)
Admission: RE | Admit: 2012-03-19 | Discharge: 2012-03-19 | Disposition: A | Discharge status: Home / Self Care | Payer: Medicare Other | Source: Ambulatory Visit | Attending: Internal Medicine | Admitting: Internal Medicine

## 2012-03-19 ENCOUNTER — Other Ambulatory Visit: Payer: Self-pay | Admitting: Internal Medicine

## 2012-03-19 DIAGNOSIS — R05 Cough: Secondary | ICD-10-CM

## 2012-03-19 MED ORDER — HYDROCODONE-HOMATROPINE 5-1.5 MG/5ML PO SYRP: 5.0000 mL | ORAL_SOLUTION | Freq: Three times a day (TID) | ORAL | Status: DC | PRN

## 2012-03-29 ENCOUNTER — Ambulatory Visit: Payer: Self-pay | Admitting: Unknown Physician Specialty

## 2012-03-30 ENCOUNTER — Other Ambulatory Visit: Payer: Self-pay | Admitting: *Deleted

## 2012-03-30 LAB — PATHOLOGY REPORT

## 2012-04-01 ENCOUNTER — Other Ambulatory Visit: Payer: Self-pay | Admitting: *Deleted

## 2012-04-01 MED ORDER — SIMVASTATIN 40 MG PO TABS: 40.0000 mg | ORAL_TABLET | Freq: Every evening | ORAL | Status: DC

## 2012-04-01 NOTE — Telephone Encounter (Signed)
Med filled.  

## 2012-07-08 ENCOUNTER — Telehealth: Payer: Self-pay | Admitting: Internal Medicine

## 2012-07-08 NOTE — Telephone Encounter (Signed)
PATIENT NEEDS TO BE SCHEDULED FOR A PHYSICAL IN MID July OR LATER.

## 2012-07-08 NOTE — Telephone Encounter (Signed)
I spoke with the patient's husband and rescheduled to a date that was suitable for him.

## 2012-07-08 NOTE — Telephone Encounter (Signed)
Pt stated she would need to check with spouse to se if 09/08/12 @ 1:30 appointment was ok

## 2012-08-27 ENCOUNTER — Other Ambulatory Visit: Payer: Self-pay | Admitting: Internal Medicine

## 2012-08-27 NOTE — Telephone Encounter (Signed)
Has appt 09/28/12

## 2012-09-08 ENCOUNTER — Encounter: Payer: Medicare Other | Admitting: Internal Medicine

## 2012-09-26 ENCOUNTER — Other Ambulatory Visit: Payer: Self-pay | Admitting: Internal Medicine

## 2012-09-27 NOTE — Telephone Encounter (Signed)
Has appt 09/28/12 

## 2012-09-28 ENCOUNTER — Encounter: Payer: Self-pay | Admitting: Internal Medicine

## 2012-09-28 ENCOUNTER — Ambulatory Visit (INDEPENDENT_AMBULATORY_CARE_PROVIDER_SITE_OTHER): Payer: Medicare Other | Admitting: Internal Medicine

## 2012-09-28 VITALS — BP 132/92 | HR 71 | Temp 97.7°F | Resp 14 | Ht 68.0 in | Wt 180.5 lb

## 2012-09-28 DIAGNOSIS — E559 Vitamin D deficiency, unspecified: Secondary | ICD-10-CM

## 2012-09-28 DIAGNOSIS — R5381 Other malaise: Secondary | ICD-10-CM

## 2012-09-28 DIAGNOSIS — N904 Leukoplakia of vulva: Secondary | ICD-10-CM

## 2012-09-28 DIAGNOSIS — L940 Localized scleroderma [morphea]: Secondary | ICD-10-CM

## 2012-09-28 DIAGNOSIS — Z23 Encounter for immunization: Secondary | ICD-10-CM

## 2012-09-28 DIAGNOSIS — Z1239 Encounter for other screening for malignant neoplasm of breast: Secondary | ICD-10-CM

## 2012-09-28 DIAGNOSIS — R7309 Other abnormal glucose: Secondary | ICD-10-CM

## 2012-09-28 DIAGNOSIS — F329 Major depressive disorder, single episode, unspecified: Secondary | ICD-10-CM

## 2012-09-28 DIAGNOSIS — F0283 Dementia in other diseases classified elsewhere, unspecified severity, with mood disturbance: Secondary | ICD-10-CM

## 2012-09-28 DIAGNOSIS — F0280 Dementia in other diseases classified elsewhere without behavioral disturbance: Secondary | ICD-10-CM

## 2012-09-28 DIAGNOSIS — E785 Hyperlipidemia, unspecified: Secondary | ICD-10-CM

## 2012-09-28 DIAGNOSIS — R739 Hyperglycemia, unspecified: Secondary | ICD-10-CM

## 2012-09-28 DIAGNOSIS — Z1211 Encounter for screening for malignant neoplasm of colon: Secondary | ICD-10-CM

## 2012-09-28 DIAGNOSIS — Z0000 Encounter for general adult medical examination without abnormal findings: Secondary | ICD-10-CM

## 2012-09-28 NOTE — Assessment & Plan Note (Signed)
Managed with clobetasol ointment. Continue use daily.

## 2012-09-28 NOTE — Assessment & Plan Note (Signed)
Patient's disability has progressed. She requires full-time care now for management of hygiene meal preparation dressing and supervision. She is a full-time caregiver during the day, Maralyn Sago a family relative. Her husband is with her at night

## 2012-09-28 NOTE — Patient Instructions (Addendum)
Lorraine Andrade's exam was relatively normal.  Please have Lorraine Andrade continue to apply mositurizer to her legs twice daily (eucerin)   And continue the vaginal steroid cream once daily .Marland Kitchen   Lorraine Andrade has gained 13 lbs over the last 15 months.   I recommend a daily walk after lunch or dinner to distract her from the desserts   Have Lorraine Andrade bring her back for fasting labs at your convenience  I will look into the availability and precision of any tests for inheritability

## 2012-09-28 NOTE — Assessment & Plan Note (Signed)
Given her progressive dementia colonoscopy is no longer required. Annual immunological fecal occult blood testing cards will be used to screen for colon cancer.

## 2012-09-28 NOTE — Assessment & Plan Note (Signed)
Annual comprehensive exam was done including breast, pelvic was done . All screenings and vaccinatiosn have been addressed .

## 2012-09-28 NOTE — Progress Notes (Signed)
Patient ID: Lorraine Andrade, female   DOB: August 21, 1950, 62 y.o.   MRN: [id number]   Patient Active Problem List   Diagnosis Date Noted  . Routine general medical examination at a health care facility 09/28/2012  . Lichen sclerosus of female genitalia   . Annual physical exam 05/19/2011  . Screening for colon cancer 05/19/2011  . Dementia of Alzheimer's type, with early onset, with depressed mood 01/22/2011  . Hyperlipidemia LDL goal <100 01/22/2011  . Migraine headache 01/22/2011    Subjective:  CC:   Chief Complaint  Patient presents with  . Annual Exam    HPI:   Lorraine Andrade a 62 y.o. female who presents for her Annual exam.  She's accompanied by her husband Lorraine Andrade today. Lorraine Andrade has progressive Alzheimer's dementia diagnosed approximately 2 years ago and managed by Dr. Shannan Harper at [facility]. Fortunately Dr. Shannan Harper is retiring and there are no comparable neurologist to take place. Lorraine Andrade has been declining progressively. She now has full-time supervision and requires assistance with bathing dressing and preparing meals. She has developed a sweet tooth and has gained 13 pounds over the last year. Her conversational skills remain adequate that she is not able to generate much independent for spontaneous conversation. She remains jovial  In spirit most days.  However. She is currently taking Namenda only. Trial of Exelon caused agitation.   Past Medical History  Diagnosis Date  . Hyperlipidemia   . Depression   . Migraine   . Dementia of Alzheimer's type, with early onset, with depressed mood   . Lichen sclerosus of female genitalia     Past Surgical History  Procedure Laterality Date  . Salpingectomy  1998    bilateral  . Abdominal hysterectomy  1998  . Cystocele repair  2010    [facility]       The following portions of the patient's history were reviewed and updated as appropriate: Allergies, current medications, and problem list.    Review of Systems:    12 Pt  review of systems was negative except those addressed in the HPI,     History   Social History  . Marital Status: Married    Spouse Name: N/A    Number of Children: N/A  . Years of Education: N/A   Occupational History  . Not on file.   Social History Main Topics  . Smoking status: Never Smoker   . Smokeless tobacco: Never Used  . Alcohol Use: No  . Drug Use: No  . Sexual Activity: Not Currently   Other Topics Concern  . Not on file   Social History Narrative  . No narrative on file    Objective:  Filed Vitals:   09/28/12 1033  BP: 132/92  Pulse: 71  Temp: 97.7 F (36.5 C)  Resp: 14    General Appearance:    Alert, cooperative, no distress, appears stated age  Head:    Normocephalic, without obvious abnormality, atraumatic  Eyes:    PERRL, conjunctiva/corneas clear, EOM's intact, fundi    benign, both eyes  Ears:    Normal TM's and external ear canals, both ears  Nose:   Nares normal, septum midline, mucosa normal, no drainage    or sinus tenderness  Throat:   Lips, mucosa, and tongue normal; teeth and gums normal  Neck:   Supple, symmetrical, trachea midline, no adenopathy;    thyroid:  no enlargement/tenderness/nodules; no carotid   bruit or JVD  Back:     Symmetric, no  curvature, ROM normal, no CVA tenderness  Lungs:     Clear to auscultation bilaterally, respirations unlabored  Chest Wall:    No tenderness or deformity   Heart:    Regular rate and rhythm, S1 and S2 normal, no murmur, rub   or gallop  Breast Exam:    No tenderness, masses, or nipple abnormality  Abdomen:     Soft, non-tender, bowel sounds active all four quadrants,    no masses, no organomegaly  Genitalia:    Pelvic: external genitalia norma atrophid with lichenification, no adnexal masses or tenderness, cervix and uterus surgically absent , rectovaginal septum normal, and vagina normal without discharge  Extremities:   Extremities normal, atraumatic, no cyanosis or edema   Pulses:   2+ and symmetric all extremities  Skin:   Extreme xerosis with lichenification, no rashes or lesions  Lymph nodes:   Cervical, supraclavicular, and axillary nodes normal  Neurologic:   CNII-XII intact, normal strength, sensation and reflexes    Throughout, normal cerebellar function.      Assessment and Plan:  Routine general medical examination at a health care facility Annual comprehensive exam was done including breast, pelvic was done . All screenings and vaccinatiosn have been addressed .   Screening for colon cancer Given her progressive dementia colonoscopy is no longer required. Annual immunological fecal occult blood testing cards will be used to screen for colon cancer.  Lichen sclerosus of female genitalia Managed with clobetasol ointment. Continue use daily.  Dementia of Alzheimer's type, with early onset, with depressed mood Patient's disability has progressed. She requires full-time care now for management of hygiene meal preparation dressing and supervision. She is a full-time caregiver during the day, Lorraine Andrade a family relative. Her husband is with her at night   Updated Medication List Outpatient Encounter Prescriptions as of 09/28/2012  Medication Sig Dispense Refill  . clobetasol ointment (TEMOVATE) 0.05 % Apply 1 application topically at bedtime.  30 g  5  . Memantine HCl ER (NAMENDA XR) 28 MG CP24 Take 1 tablet by mouth daily.  30 capsule  11  . sertraline (ZOLOFT) 100 MG tablet TAKE ONE TABLET BY MOUTH EVERY DAY  30 tablet  0  . simvastatin (ZOCOR) 40 MG tablet TAKE  ONE TABLET BY MOUTH EVERY EVENING  30 tablet  0  . SUMAtriptan (IMITREX) 50 MG tablet Take 1 tablet (50 mg total) by mouth once as needed for migraine.  30 tablet  2  . [DISCONTINUED] HYDROcodone-homatropine (HYCODAN) 5-1.5 MG/5ML syrup Take 5 mLs by mouth every 8 (eight) hours as needed for cough.  180 mL  0  . [DISCONTINUED] rivastigmine (EXELON) 4.6 mg/24hr Place 1 patch (4.6 mg total)  onto the skin daily.  30 patch  3   No facility-administered encounter medications on file as of 09/28/2012.     Orders Placed This Encounter  Procedures  . Fecal occult blood, imunochemical  . MM Digital Screening  . CBC with Differential  . Comprehensive metabolic panel  . TSH  . Lipid panel  . Vit D25 hydroxy(osteoporosis monitoring)  . Hemoglobin A1c    No Follow-up on file.

## 2012-09-28 NOTE — Addendum Note (Signed)
Addended by: Dennie Bible on: 09/28/2012 03:24 PM   Modules accepted: Orders

## 2012-10-14 ENCOUNTER — Other Ambulatory Visit (INDEPENDENT_AMBULATORY_CARE_PROVIDER_SITE_OTHER): Payer: Medicare Other

## 2012-10-14 DIAGNOSIS — E559 Vitamin D deficiency, unspecified: Secondary | ICD-10-CM

## 2012-10-14 DIAGNOSIS — R739 Hyperglycemia, unspecified: Secondary | ICD-10-CM

## 2012-10-14 DIAGNOSIS — R5381 Other malaise: Secondary | ICD-10-CM

## 2012-10-14 DIAGNOSIS — R7309 Other abnormal glucose: Secondary | ICD-10-CM

## 2012-10-14 LAB — CBC WITH DIFFERENTIAL/PLATELET
Basophils Relative: 0.7 % (ref 0.0–3.0)
Eosinophils Absolute: 0.2 10*3/uL (ref 0.0–0.7)
Eosinophils Relative: 2.8 % (ref 0.0–5.0)
Hemoglobin: 13.7 g/dL (ref 12.0–15.0)
Lymphocytes Relative: 32.2 % (ref 12.0–46.0)
MCHC: 33.6 g/dL (ref 30.0–36.0)
Neutro Abs: 4.8 10*3/uL (ref 1.4–7.7)
RBC: 4.92 Mil/uL (ref 3.87–5.11)

## 2012-10-14 LAB — LIPID PANEL
Cholesterol: 163 mg/dL (ref 0–200)
Triglycerides: 221 mg/dL — ABNORMAL HIGH (ref 0.0–149.0)

## 2012-10-14 LAB — COMPREHENSIVE METABOLIC PANEL
AST: 23 U/L (ref 0–37)
BUN: 15 mg/dL (ref 6–23)
CO2: 27 mEq/L (ref 19–32)
Calcium: 9.1 mg/dL (ref 8.4–10.5)
Chloride: 108 mEq/L (ref 96–112)
Creatinine, Ser: 0.7 mg/dL (ref 0.4–1.2)
GFR: 92.25 mL/min (ref >60.00)
Glucose, Bld: 106 mg/dL — ABNORMAL HIGH (ref 70–99)

## 2012-10-14 LAB — TSH: TSH: 1.25 u[IU]/mL (ref 0.35–5.50)

## 2012-10-20 ENCOUNTER — Other Ambulatory Visit: Payer: Self-pay | Admitting: Internal Medicine

## 2012-12-15 ENCOUNTER — Other Ambulatory Visit: Payer: Self-pay | Admitting: Internal Medicine

## 2012-12-15 NOTE — Telephone Encounter (Signed)
Eprescribed.

## 2012-12-20 ENCOUNTER — Other Ambulatory Visit: Payer: Self-pay | Admitting: Internal Medicine

## 2013-01-19 ENCOUNTER — Ambulatory Visit (INDEPENDENT_AMBULATORY_CARE_PROVIDER_SITE_OTHER): Payer: Medicare Other

## 2013-01-19 DIAGNOSIS — Z23 Encounter for immunization: Secondary | ICD-10-CM

## 2013-01-27 ENCOUNTER — Other Ambulatory Visit: Payer: Self-pay | Admitting: Internal Medicine

## 2013-03-09 ENCOUNTER — Ambulatory Visit: Payer: Self-pay | Admitting: Internal Medicine

## 2013-04-13 ENCOUNTER — Encounter: Payer: Self-pay | Admitting: Internal Medicine

## 2013-05-02 ENCOUNTER — Other Ambulatory Visit: Payer: Self-pay | Admitting: Internal Medicine

## 2013-06-01 ENCOUNTER — Other Ambulatory Visit: Payer: Self-pay | Admitting: Internal Medicine

## 2013-06-01 ENCOUNTER — Other Ambulatory Visit: Payer: Self-pay | Admitting: *Deleted

## 2013-06-01 NOTE — Telephone Encounter (Signed)
Electronic Rx request for zoloft, Pt last office visit was 09/28/12, no future appts scheduled at this time. Please advise.

## 2013-06-02 MED ORDER — SERTRALINE HCL 100 MG PO TABS: ORAL_TABLET | ORAL | Status: DC

## 2013-06-02 NOTE — Telephone Encounter (Signed)
Ok to refill without appt.  Patient is well known to me

## 2013-06-08 ENCOUNTER — Encounter: Payer: Self-pay | Admitting: Internal Medicine

## 2013-06-08 ENCOUNTER — Telehealth: Payer: Self-pay | Admitting: Internal Medicine

## 2013-06-08 ENCOUNTER — Other Ambulatory Visit: Payer: Self-pay | Admitting: *Deleted

## 2013-06-08 ENCOUNTER — Ambulatory Visit (INDEPENDENT_AMBULATORY_CARE_PROVIDER_SITE_OTHER): Payer: Medicare Other | Admitting: Internal Medicine

## 2013-06-08 VITALS — BP 124/70 | HR 72 | Temp 97.7°F | Resp 16 | Wt 178.5 lb

## 2013-06-08 DIAGNOSIS — Z79899 Other long term (current) drug therapy: Secondary | ICD-10-CM

## 2013-06-08 DIAGNOSIS — R1024 Suprapubic pain: Secondary | ICD-10-CM

## 2013-06-08 DIAGNOSIS — J358 Other chronic diseases of tonsils and adenoids: Secondary | ICD-10-CM

## 2013-06-08 DIAGNOSIS — F0390 Unspecified dementia without behavioral disturbance: Secondary | ICD-10-CM

## 2013-06-08 DIAGNOSIS — R300 Dysuria: Secondary | ICD-10-CM

## 2013-06-08 DIAGNOSIS — R102 Pelvic and perineal pain: Secondary | ICD-10-CM

## 2013-06-08 DIAGNOSIS — G300 Alzheimer's disease with early onset: Secondary | ICD-10-CM

## 2013-06-08 DIAGNOSIS — G309 Alzheimer's disease, unspecified: Secondary | ICD-10-CM

## 2013-06-08 DIAGNOSIS — R5381 Other malaise: Secondary | ICD-10-CM

## 2013-06-08 DIAGNOSIS — N390 Urinary tract infection, site not specified: Secondary | ICD-10-CM

## 2013-06-08 DIAGNOSIS — F329 Major depressive disorder, single episode, unspecified: Secondary | ICD-10-CM

## 2013-06-08 DIAGNOSIS — F0393 Unspecified dementia, unspecified severity, with mood disturbance: Secondary | ICD-10-CM

## 2013-06-08 DIAGNOSIS — R109 Unspecified abdominal pain: Secondary | ICD-10-CM

## 2013-06-08 DIAGNOSIS — F0283 Dementia in other diseases classified elsewhere, unspecified severity, with mood disturbance: Secondary | ICD-10-CM

## 2013-06-08 DIAGNOSIS — R52 Pain, unspecified: Secondary | ICD-10-CM

## 2013-06-08 DIAGNOSIS — F0280 Dementia in other diseases classified elsewhere without behavioral disturbance: Secondary | ICD-10-CM

## 2013-06-08 DIAGNOSIS — R5383 Other fatigue: Secondary | ICD-10-CM

## 2013-06-08 LAB — POCT URINALYSIS DIPSTICK
Bilirubin, UA: NEGATIVE
Blood, UA: NEGATIVE
Glucose, UA: NEGATIVE
Ketones, UA: NEGATIVE
Nitrite, UA: NEGATIVE
PROTEIN UA: NEGATIVE
Spec Grav, UA: 1.01
Urobilinogen, UA: 0.2
pH, UA: 6

## 2013-06-08 LAB — CBC WITH DIFFERENTIAL/PLATELET
BASOS ABS: 0 10*3/uL (ref 0.0–0.1)
Basophils Relative: 0.3 % (ref 0.0–3.0)
Eosinophils Absolute: 0.2 10*3/uL (ref 0.0–0.7)
Eosinophils Relative: 1.1 % (ref 0.0–5.0)
HCT: 41.5 % (ref 36.0–46.0)
Hemoglobin: 13.8 g/dL (ref 12.0–15.0)
LYMPHS ABS: 3.6 10*3/uL (ref 0.7–4.0)
LYMPHS PCT: 23.3 % (ref 12.0–46.0)
MCHC: 33.3 g/dL (ref 30.0–36.0)
MCV: 83.6 fl (ref 78.0–100.0)
MONOS PCT: 7.7 % (ref 3.0–12.0)
Monocytes Absolute: 1.2 10*3/uL — ABNORMAL HIGH (ref 0.1–1.0)
Neutro Abs: 10.5 10*3/uL — ABNORMAL HIGH (ref 1.4–7.7)
Neutrophils Relative %: 67.6 % (ref 43.0–77.0)
Platelets: 291 10*3/uL (ref 150.0–400.0)
RBC: 4.97 Mil/uL (ref 3.87–5.11)
RDW: 13.6 % (ref 11.5–14.6)
WBC: 15.5 10*3/uL — ABNORMAL HIGH (ref 4.5–10.5)

## 2013-06-08 LAB — COMPREHENSIVE METABOLIC PANEL
ALT: 30 U/L (ref 0–35)
AST: 21 U/L (ref 0–37)
Albumin: 4.1 g/dL (ref 3.5–5.2)
Alkaline Phosphatase: 82 U/L (ref 39–117)
BILIRUBIN TOTAL: 0.7 mg/dL (ref 0.3–1.2)
BUN: 14 mg/dL (ref 6–23)
CO2: 26 meq/L (ref 19–32)
Calcium: 9.4 mg/dL (ref 8.4–10.5)
Chloride: 103 mEq/L (ref 96–112)
Creatinine, Ser: 0.5 mg/dL (ref 0.4–1.2)
GFR: 122.15 mL/min (ref >60.00)
Glucose, Bld: 51 mg/dL — ABNORMAL LOW (ref 70–99)
Potassium: 4.2 mEq/L (ref 3.5–5.1)
Sodium: 137 mEq/L (ref 135–145)
TOTAL PROTEIN: 6.6 g/dL (ref 6.0–8.3)

## 2013-06-08 LAB — TSH: TSH: 2.19 u[IU]/mL (ref 0.35–5.50)

## 2013-06-08 MED ORDER — CIPROFLOXACIN HCL 250 MG PO TABS: 250.0000 mg | ORAL_TABLET | Freq: Two times a day (BID) | ORAL | Status: DC

## 2013-06-08 MED ORDER — SERTRALINE HCL 100 MG PO TABS: ORAL_TABLET | ORAL | Status: DC

## 2013-06-08 NOTE — Telephone Encounter (Signed)
Patient Information:  Caller Name: Maralyn SagoSarah  Phone: [phone]  Patient: Lorraine Andrade, Lorraine Andrade  Gender: Female  DOB: [birth year]  Age: [age] Years  PCP: Duncan Dullullo, Teresa (Adults only)  Office Follow Up:  Does the office need to follow up with this patient?: No  Instructions For The Office: N/A  RN Note:  Contacted the office and spoke with Olegario MessierKathy, advised Dr Darrick Huntsmanullo would work pt in at 1130 today, 06/08/13.  Symptoms  Reason For Call & Symptoms: Care giver/Sarah reports pt has c/o low abdominal pain.  Reviewed Health History In EMR: Yes  Reviewed Medications In EMR: Yes  Reviewed Allergies In EMR: Yes  Reviewed Surgeries / Procedures: Yes  Date of Onset of Symptoms: 06/07/2013  Guideline(s) Used:  Abdominal Pain - Female  Disposition Per Guideline:   See Today in Office  Reason For Disposition Reached:   Age > 60 years  Advice Given:  Call Back If:  You become worse.  Patient Will Follow Care Advice:  YES  Appointment Scheduled:  06/08/2013 11:30:00 Appointment Scheduled Provider:  Duncan Dullullo, Teresa (Adults only)

## 2013-06-08 NOTE — Progress Notes (Signed)
Pre-visit discussion using our clinic review tool. No additional management support is needed unless otherwise documented below in the visit note.  

## 2013-06-08 NOTE — Telephone Encounter (Signed)
Patient has appointment at 11.30 today patient notified.

## 2013-06-08 NOTE — Telephone Encounter (Signed)
States pt has complaints of low back pain and abdominal pain.  ?bladder or kidney infection.  Pt has dementia; states it is hard to get answers.  Would prefer pt be seen by Dr. Darrick Huntsmanullo due to dementia.

## 2013-06-08 NOTE — Telephone Encounter (Signed)
Pharmacy Note:  Zocor  Pt wants Rx for #90

## 2013-06-08 NOTE — Progress Notes (Signed)
Patient ID: Lorraine Andrade, female   DOB: 22-Apr-1950, 63 y.o.   MRN: [id number]  Patient Active Problem List   Diagnosis Date Noted  . UTI (urinary tract infection) 06/10/2013  . Tonsil stone 06/10/2013  . Routine general medical examination at a health care facility 09/28/2012  . Lichen sclerosus of female genitalia   . Annual physical exam 05/19/2011  . Screening for colon cancer 05/19/2011  . Dementia of Alzheimer's type, with early onset, with depressed mood 01/22/2011  . Hyperlipidemia LDL goal <100 01/22/2011  . Migraine headache 01/22/2011    Subjective:  CC:   Chief Complaint  Patient presents with  . Acute Visit  . Urinary Tract Infection    HPI:   Lorraine Andrade is a 63 y.o. female who presents for Suprapubic pain for the last 24 hours,  No diarrhea  No recent use of antibiotics  History  Of pelvic sling in the past 2 years .  History of persistent halitosis noted by family, found to be caused by a tonsil stone found by ENT evaluation by  per Dr. Andee PolesVaught     Past Medical History  Diagnosis Date  . Hyperlipidemia   . Depression   . Migraine   . Dementia of Alzheimer's type, with early onset, with depressed mood   . Lichen sclerosus of female genitalia     Past Surgical History  Procedure Laterality Date  . Salpingectomy  1998    bilateral  . Abdominal hysterectomy  1998  . Cystocele repair  2010    [facility]       The following portions of the patient's history were reviewed and updated as appropriate: Allergies, current medications, and problem list.    Review of Systems:   Patient denies headache, fevers, malaise, unintentional weight loss, skin rash, eye pain, sinus congestion and sinus pain, sore throat, dysphagia,  hemoptysis , cough, dyspnea, wheezing, chest pain, palpitations, orthopnea, edema, abdominal pain, nausea, melena, diarrhea, constipation, flank pain, dysuria, hematuria, urinary  Frequency, nocturia, numbness, tingling, seizures,   Focal weakness, Loss of consciousness,  Tremor, insomnia, depression, anxiety, and suicidal ideation.     History   Social History  . Marital Status: Married    Spouse Name: N/A    Number of Children: N/A  . Years of Education: N/A   Occupational History  . Not on file.   Social History Main Topics  . Smoking status: Never Smoker   . Smokeless tobacco: Never Used  . Alcohol Use: No  . Drug Use: No  . Sexual Activity: Not Currently   Other Topics Concern  . Not on file   Social History Narrative  . No narrative on file    Objective:  Filed Vitals:   06/08/13 1154  BP: 124/70  Pulse: 72  Temp: 97.7 F (36.5 C)  Resp: 16     General appearance: alert, cooperative and appears stated age Ears: normal TM's and external ear canals both ears Throat: lips, mucosa, and tongue normal; teeth and gums normal Neck: no adenopathy, no carotid bruit, supple, symmetrical, trachea midline and thyroid not enlarged, symmetric, no tenderness/mass/nodules Back: symmetric, no curvature. ROM normal. No CVA tenderness. Lungs: clear to auscultation bilaterally Heart: regular rate and rhythm, S1, S2 normal, no murmur, click, rub or gallop Abdomen: soft, non-tender; bowel sounds normal; no masses,  no organomegaly Pulses: 2+ and symmetric Skin: Skin color, texture, turgor normal. No rashes or lesions Lymph nodes: Cervical, supraclavicular, and axillary nodes normal.  Assessment and  Plan:  Dementia of Alzheimer's type, with early onset, with depressed mood Progressive.  patient's communication skills are now nitceably diminished due to word finding difficult.  She remains continent of stool and urine and able to feed herself but needs assistance preparing meals, taking meds, Showering,  Grooming and dressing  UTI (urinary tract infection) Suggested by history, exam and UA>  Empiric cipro. Culture is pending   Tonsil stone Continue warm salt water gargles.    Updated Medication  List Outpatient Encounter Prescriptions as of 06/08/2013  Medication Sig  . clobetasol ointment (TEMOVATE) 0.05 % APPLY 1 APPLICATION TOPICALLY 2 (TWO) TIMES DAILY.  Marland Kitchen. NAMENDA XR 28 MG CP24 TAKE ONE CAPSULE BY MOUTH EVERY DAY  . sertraline (ZOLOFT) 100 MG tablet TAKE 1 TABLET DAILY .  Marland Kitchen. simvastatin (ZOCOR) 40 MG tablet TAKE  ONE TABLET BY MOUTH EVERY EVENING  . [DISCONTINUED] sertraline (ZOLOFT) 100 MG tablet TAKE 1 TABLET DAILY .Marland Kitchen.Marland Kitchen.NEED OFFICE VIST FOR ADDITIONAL REFILLS!!  . ciprofloxacin (CIPRO) 250 MG tablet Take 1 tablet (250 mg total) by mouth 2 (two) times daily.  . SUMAtriptan (IMITREX) 50 MG tablet Take 1 tablet (50 mg total) by mouth once as needed for migraine.     Orders Placed This Encounter  Procedures  . Urine culture  . Comprehensive metabolic panel  . TSH  . CBC with Differential  . POCT Urinalysis Dipstick    No Follow-up on file.

## 2013-06-08 NOTE — Patient Instructions (Signed)
I am treating your for  UTI with one week of cipro since your UA was suggestive of infection  We did not have enough urine for a culture,  So take the entire course, and if your symptoms do not improve,  We will repeat the urine test and do a pelvic exam   Give [place] my regards!

## 2013-06-09 ENCOUNTER — Encounter: Payer: Self-pay | Admitting: *Deleted

## 2013-06-10 ENCOUNTER — Encounter: Payer: Self-pay | Admitting: Internal Medicine

## 2013-06-10 DIAGNOSIS — N390 Urinary tract infection, site not specified: Secondary | ICD-10-CM | POA: Insufficient documentation

## 2013-06-10 DIAGNOSIS — J358 Other chronic diseases of tonsils and adenoids: Secondary | ICD-10-CM | POA: Insufficient documentation

## 2013-06-10 LAB — URINE CULTURE
Colony Count: NO GROWTH
Organism ID, Bacteria: NO GROWTH

## 2013-06-10 NOTE — Assessment & Plan Note (Signed)
Continue warm salt water gargles.

## 2013-06-10 NOTE — Assessment & Plan Note (Signed)
Progressive.  patient's communication skills are now nitceably diminished due to word finding difficult.  She remains continent of stool and urine and able to feed herself but needs assistance preparing meals, taking meds, Showering,  Grooming and dressing

## 2013-06-10 NOTE — Assessment & Plan Note (Addendum)
Suggested by history, exam and UA>  Empiric cipro. Culture is pending

## 2013-06-20 ENCOUNTER — Other Ambulatory Visit: Payer: Self-pay | Admitting: Internal Medicine

## 2013-06-21 ENCOUNTER — Other Ambulatory Visit: Payer: Self-pay | Admitting: Internal Medicine

## 2013-06-21 DIAGNOSIS — G2581 Restless legs syndrome: Secondary | ICD-10-CM | POA: Insufficient documentation

## 2013-06-21 MED ORDER — ROPINIROLE HCL 0.5 MG PO TABS: 0.5000 mg | ORAL_TABLET | Freq: Every day | ORAL | Status: DC

## 2013-07-05 ENCOUNTER — Telehealth: Payer: Self-pay | Admitting: Internal Medicine

## 2013-07-25 ENCOUNTER — Telehealth: Payer: Self-pay | Admitting: Internal Medicine

## 2013-07-25 MED ORDER — MEMANTINE HCL 2 MG/ML PO SOLN: 5.0000 mL | Freq: Two times a day (BID) | ORAL | Status: DC

## 2013-07-25 MED ORDER — SERTRALINE HCL 20 MG/ML PO CONC: 100.0000 mg | Freq: Every day | ORAL | Status: AC

## 2013-07-25 NOTE — Telephone Encounter (Signed)
Patient DPR notified. °

## 2013-07-25 NOTE — Telephone Encounter (Signed)
Liquid zoloft and namanda were both sent to pharmacy

## 2013-07-25 NOTE — Telephone Encounter (Signed)
Patients caregivder called and stated patient is having difficulty swallowing sertraline and namenda XR and wanted to know if script could be called for liquid form. liquiod is available on sertraline but not the Namenda XR but is available with regular namenda. Please advise.

## 2013-10-13 ENCOUNTER — Encounter: Payer: Medicare Other | Admitting: Internal Medicine

## 2013-10-20 ENCOUNTER — Telehealth: Payer: Self-pay | Admitting: *Deleted

## 2013-10-20 NOTE — Telephone Encounter (Signed)
Pt's husband in office today. Pt has appointment on Monday with Dr. Darrick Huntsman. If you have any time prior to that appointment to call him, he would like to discuss a few things with you so that he doesn't have to mention in front of patient. He understands if you do not have time prior to then. May call on cell # [phone]

## 2013-10-24 ENCOUNTER — Encounter: Payer: Self-pay | Admitting: Internal Medicine

## 2013-10-24 ENCOUNTER — Encounter (INDEPENDENT_AMBULATORY_CARE_PROVIDER_SITE_OTHER): Payer: Self-pay

## 2013-10-24 ENCOUNTER — Ambulatory Visit (INDEPENDENT_AMBULATORY_CARE_PROVIDER_SITE_OTHER): Payer: Medicare Other | Admitting: Internal Medicine

## 2013-10-24 VITALS — BP 138/88 | HR 68 | Temp 98.4°F | Resp 16 | Ht 68.0 in | Wt 175.8 lb

## 2013-10-24 DIAGNOSIS — F329 Major depressive disorder, single episode, unspecified: Secondary | ICD-10-CM

## 2013-10-24 DIAGNOSIS — Z1239 Encounter for other screening for malignant neoplasm of breast: Secondary | ICD-10-CM

## 2013-10-24 DIAGNOSIS — Z0000 Encounter for general adult medical examination without abnormal findings: Secondary | ICD-10-CM

## 2013-10-24 DIAGNOSIS — R7301 Impaired fasting glucose: Secondary | ICD-10-CM

## 2013-10-24 DIAGNOSIS — Z23 Encounter for immunization: Secondary | ICD-10-CM

## 2013-10-24 DIAGNOSIS — F0280 Dementia in other diseases classified elsewhere without behavioral disturbance: Secondary | ICD-10-CM

## 2013-10-24 DIAGNOSIS — Z01419 Encounter for gynecological examination (general) (routine) without abnormal findings: Secondary | ICD-10-CM

## 2013-10-24 DIAGNOSIS — R5381 Other malaise: Secondary | ICD-10-CM

## 2013-10-24 DIAGNOSIS — Z2911 Encounter for prophylactic immunotherapy for respiratory syncytial virus (RSV): Secondary | ICD-10-CM

## 2013-10-24 DIAGNOSIS — E785 Hyperlipidemia, unspecified: Secondary | ICD-10-CM

## 2013-10-24 DIAGNOSIS — G300 Alzheimer's disease with early onset: Secondary | ICD-10-CM

## 2013-10-24 DIAGNOSIS — R5383 Other fatigue: Secondary | ICD-10-CM

## 2013-10-24 DIAGNOSIS — N904 Leukoplakia of vulva: Secondary | ICD-10-CM

## 2013-10-24 DIAGNOSIS — F0283 Dementia in other diseases classified elsewhere, unspecified severity, with mood disturbance: Secondary | ICD-10-CM

## 2013-10-24 MED ORDER — CLOBETASOL PROP EMOLLIENT BASE 0.05 % EX CREA: TOPICAL_CREAM | CUTANEOUS | Status: DC

## 2013-10-24 NOTE — Progress Notes (Signed)
Pre-visit discussion using our clinic review tool. No additional management support is needed unless otherwise documented below in the visit note.  

## 2013-10-24 NOTE — Progress Notes (Signed)
Patient ID: Lorraine Andrade, female   DOB: 1950-10-02, 63 y.o.   MRN: [id number]  The patient is here for annual Medicare wellness examination and management of other chronic and acute problems.  She has advanced Alzheimers dementia and now requires full time supervision with caregivers.  She is accompanied by husband Doreatha Martin and niece Raynelle Fanning.     The risk factors are reflected in the social history.  The roster of all physicians providing medical care to patient - is listed in the Snapshot section of the chart.  Activities of daily living:  The patient is 100% independent in all ADLs: dressing, toileting, feeding as well as independent mobility  Home safety : The patient has smoke detectors in the home. They wear seatbelts.  There are no firearms at home. There is no violence in the home.   There is no risks for hepatitis, STDs or HIV. There is no   history of blood transfusion. They have no travel history to infectious disease endemic areas of the world.  The patient has seen their dentist in the last six month. They have seen their eye doctor in the last year. They have no  hearing difficulty with regard to whispered voices and some television programs.  They have deferred audiologic testing in the last year.  They do not  have excessive sun exposure. Discussed the need for sun protection: hats, long sleeves and use of sunscreen if there is significant sun exposure.   Diet: the importance of a healthy diet is discussed. They do have a healthy diet.  The benefits of regular aerobic exercise were discussed. She walks 2 times per week ,  20 minutes.   Depression screen: there are no signs or vegative symptoms of depression- irritability, change in appetite, anhedonia, sadness/tearfullness and she is tolerating liquid zoloft. .  Cognitive assessment: the patient has lost all execuive function and requies continual supervision of all  financial and personal affairs but is actively engaged. They could  not relate day,date,year and events; recalled 0/3 objects at 3 minutes; nad could not perform clock-face test .  The following portions of the patient's history were reviewed and updated as appropriate: allergies, current medications, past family history, past medical history,  past surgical history, past social history  and problem list.  Visual acuity was not assessed per patient preference since she has regular follow up with her ophthalmologist. Hearing and body mass index were assessed and reviewed.   During the course of the visit the patient was educated and counseled about appropriate screening and preventive services including : fall prevention , diabetes screening, nutrition counseling, colorectal cancer screening, and recommended immunizations.    Objective:  BP 138/88  Pulse 68  Temp(Src) 98.4 F (36.9 C) (Oral)  Resp 16  Ht  (1.727 m)  Wt 175 lb 12 oz (79.72 kg)  BMI 26.73 kg/m2  SpO2 98%  General Appearance:    Alert, cooperative, no distress, appears stated age  Head:    Normocephalic, without obvious abnormality, atraumatic  Eyes:    PERRL, conjunctiva/corneas clear, EOM's intact, fundi    benign, both eyes  Ears:    Normal TM's and external ear canals, both ears  Nose:   Nares normal, septum midline, mucosa normal, no drainage    or sinus tenderness  Throat:   Lips, mucosa, and tongue normal; teeth and gums normal  Neck:   Supple, symmetrical, trachea midline, no adenopathy;    thyroid:  no enlargement/tenderness/nodules; no carotid  bruit or JVD  Back:     Symmetric, no curvature, ROM normal, no CVA tenderness  Lungs:     Clear to auscultation bilaterally, respirations unlabored  Chest Wall:    No tenderness or deformity   Heart:    Regular rate and rhythm, S1 and S2 normal, no murmur, rub   or gallop  Breast Exam:    No tenderness, masses, or nipple abnormality  Abdomen:     Soft, non-tender, bowel sounds active all four quadrants,    no masses, no  organomegaly  Genitalia:    Normal female without lesion, discharge or tenderness  Rectal:    deferred  Extremities:   Extremities normal, atraumatic, no cyanosis or edema  Pulses:   2+ and symmetric all extremities  Skin:   Lichenoid changes to skin (chronic)  Lymph nodes:   Cervical, supraclavicular, and axillary nodes normal  Neurologic:   CNII-XII intact, normal strength, sensation and reflexes    throughout   Assessment and Plan:  Dementia of Alzheimer's type, with early onset, with depressed mood Her mood has improved. Her dementia has progressed rapidly despite use of Aricept and namenda,  And both have been recently stopped   Lichen sclerosus of female genitalia Managed with clobetasol ointment.  Refilled with cream, not ointment per exam.   Routine general medical examination at a health care facility Annual Medicare wellness  exam was done as well as a comprehensive physical exam and management of acute and chronic conditions .  During the course of the visit the patient was educated and counseled about appropriate screening and preventive services including : fall prevention , diabetes screening, nutrition counseling, colorectal cancer screening, and recommended immunizations.  Printed recommendations for health maintenance screenings was given.    Updated Medication List Outpatient Encounter Prescriptions as of 10/24/2013  Medication Sig  . sertraline (ZOLOFT) 20 MG/ML concentrated solution Take 5 mLs (100 mg total) by mouth daily.  . ciprofloxacin (CIPRO) 250 MG tablet Take 1 tablet (250 mg total) by mouth 2 (two) times daily.  . clobetasol ointment (TEMOVATE) 0.05 % APPLY 1 APPLICATION TOPICALLY 2 (TWO) TIMES DAILY.  . Clobetasol Prop Emollient Base (CLOBETASOL PROPIONATE E) 0.05 % emollient cream Apply to vaginal area two times daily  . simvastatin (ZOCOR) 40 MG tablet TAKE  ONE TABLET BY MOUTH EVERY EVENING  . SUMAtriptan (IMITREX) 50 MG tablet Take 1 tablet (50 mg total)  by mouth once as needed for migraine.

## 2013-10-24 NOTE — Patient Instructions (Addendum)
Culturelle and Charlie Pitter jen are both good probiotics to take on a daily basis   Lorraine Andrade received the Shingles vaccine today;  When she returns in a month for fasting labs, we will give her the influenza and the Prevnar (pneumonia vaccines)

## 2013-10-25 NOTE — Assessment & Plan Note (Signed)
Managed with clobetasol ointment.  Refilled with cream, not ointment per exam.

## 2013-10-25 NOTE — Assessment & Plan Note (Signed)
Her mood has improved. Her dementia has progressed rapidly despite use of Aricept and namenda,  And both have been recently stopped

## 2013-10-25 NOTE — Assessment & Plan Note (Signed)

## 2013-11-21 ENCOUNTER — Telehealth (INDEPENDENT_AMBULATORY_CARE_PROVIDER_SITE_OTHER): Payer: Medicare Other | Admitting: Internal Medicine

## 2013-11-21 ENCOUNTER — Ambulatory Visit (INDEPENDENT_AMBULATORY_CARE_PROVIDER_SITE_OTHER): Payer: Medicare Other | Admitting: Internal Medicine

## 2013-11-21 ENCOUNTER — Encounter: Payer: Self-pay | Admitting: Internal Medicine

## 2013-11-21 VITALS — BP 130/90 | HR 81 | Temp 98.2°F | Resp 16 | Ht 68.0 in | Wt 171.2 lb

## 2013-11-21 DIAGNOSIS — N390 Urinary tract infection, site not specified: Secondary | ICD-10-CM | POA: Insufficient documentation

## 2013-11-21 DIAGNOSIS — R4182 Altered mental status, unspecified: Secondary | ICD-10-CM | POA: Insufficient documentation

## 2013-11-21 DIAGNOSIS — R300 Dysuria: Secondary | ICD-10-CM

## 2013-11-21 DIAGNOSIS — R451 Restlessness and agitation: Secondary | ICD-10-CM

## 2013-11-21 DIAGNOSIS — N3000 Acute cystitis without hematuria: Secondary | ICD-10-CM

## 2013-11-21 LAB — POCT URINALYSIS DIPSTICK
Glucose, UA: NEGATIVE
Ketones, UA: NEGATIVE
NITRITE UA: NEGATIVE
PH UA: 5.5
Protein, UA: NEGATIVE
RBC UA: NEGATIVE
Spec Grav, UA: 1.015
UROBILINOGEN UA: 0.2

## 2013-11-21 MED ORDER — CIPROFLOXACIN 500 MG/5ML (10%) PO SUSR: 250.0000 mg | Freq: Two times a day (BID) | ORAL | Status: DC

## 2013-11-21 MED ORDER — CIPROFLOXACIN HCL 250 MG PO TABS: 250.0000 mg | ORAL_TABLET | Freq: Two times a day (BID) | ORAL | Status: DC

## 2013-11-21 MED ORDER — RISPERIDONE 1 MG/ML PO SOLN: 0.2500 mg | Freq: Every day | ORAL | Status: DC

## 2013-11-21 NOTE — Patient Instructions (Addendum)
Increase the zoloft to 7.5 ml  (150 mg daily )   Adding risperdal around 9 pm to help manage the pacing,   You can add a second dose in late morning   [facility]

## 2013-11-21 NOTE — Telephone Encounter (Signed)
Placed sterile cup and hat at front desk for patient DPR to pick up, labs for UA placed.

## 2013-11-21 NOTE — Progress Notes (Signed)
Pre-visit discussion using our clinic review tool. No additional management support is needed unless otherwise documented below in the visit note.  

## 2013-11-21 NOTE — Telephone Encounter (Signed)
Husband Sam called me yesterday about mental status changes with Lorraine Andrade, who has dementia,  Please add her on to 6:30 pm and make sure she can provide a urine specimen when she gets here or in advance if he wants to pick up a sterile container

## 2013-11-21 NOTE — Assessment & Plan Note (Signed)
Empiric Ciprofloxacin x 7 days

## 2013-11-21 NOTE — Telephone Encounter (Signed)
Pt was scheduled this morning, but see rest of message regarding needing urine sample upon arrival

## 2013-11-21 NOTE — Assessment & Plan Note (Signed)
Likely triggered by UTI and  husban's out of town trip.  Due to concurrent anxiety, will increase zoloft to 150 mg daily and add risperdal qhs

## 2013-11-21 NOTE — Progress Notes (Signed)
Patient ID: Lorraine Andrade, female   DOB: May 04, 1950, 63 y.o.   MRN: [id number]   Patient Active Problem List   Diagnosis Date Noted  . Restlessness and agitation 11/21/2013  . UTI (urinary tract infection) 11/21/2013  . Restless leg syndrome 06/21/2013  . Tonsil stone 06/10/2013  . Routine general medical examination at a health care facility 09/28/2012  . Lichen sclerosus of female genitalia   . Medicare annual wellness visit, subsequent 05/19/2011  . Screening for colon cancer 05/19/2011  . Dementia of Alzheimer's type, with early onset, with depressed mood 01/22/2011  . Hyperlipidemia LDL goal <100 01/22/2011  . Migraine headache 01/22/2011    Subjective:  CC:   Chief Complaint  Patient presents with  . Acute Visit    crying alot since thursday not sleeping.    HPI:   Lorraine JunesFrances E Andrade is a 63 y.o. female who presents for ew onset Agitation and mood lability with tearfulness since Thursday, in the setting of progressive early onset Alzheimers dementia.  Husband had been out of town on business and returned on Friday.  He is present with her today along with her niece Raynelle FanningJulie who is primary caregiver .  Patient has been pacing the floors, not sleeping well at night.  Improved with 0.25 mg lorazepam,  Was able to sleep .  Patient was noted to complain of dysuria when providing urine sample this afternoon    Past Medical History  Diagnosis Date  . Hyperlipidemia   . Depression   . Migraine   . Dementia of Alzheimer's type, with early onset, with depressed mood   . Lichen sclerosus of female genitalia     Past Surgical History  Procedure Laterality Date  . Salpingectomy  1998    bilateral  . Abdominal hysterectomy  1998  . Cystocele repair  2010    [facility]       The following portions of the patient's history were reviewed and updated as appropriate: Allergies, current medications, and problem list.    Review of Systems:   Patient denies headache, fevers,  malaise, unintentional weight loss, skin rash, eye pain, sinus congestion and sinus pain, sore throat, dysphagia,  hemoptysis , cough, dyspnea, wheezing, chest pain, palpitations, orthopnea, edema, abdominal pain, nausea, melena, diarrhea, constipation, flank pain, dysuria, hematuria, urinary  Frequency, nocturia, numbness, tingling, seizures,  Focal weakness, Loss of consciousness,  Tremor, insomnia, depression, anxiety, and suicidal ideation.     History   Social History  . Marital Status: Married    Spouse Name: N/A    Number of Children: N/A  . Years of Education: N/A   Occupational History  . Not on file.   Social History Main Topics  . Smoking status: Never Smoker   . Smokeless tobacco: Never Used  . Alcohol Use: No  . Drug Use: No  . Sexual Activity: Not Currently   Other Topics Concern  . Not on file   Social History Narrative  . No narrative on file    Objective:  Filed Vitals:   11/21/13 1850  BP: 130/90  Pulse: 81  Temp: 98.2 F (36.8 C)  Resp: 16     General appearance: alert, cooperative and appears stated age  lungs: clear to auscultation bilaterally Heart: regular rate and rhythm, S1, S2 normal, no murmur, click, rub or gallop Abdomen: soft, non-tender; bowel sounds normal; no masses,  no organomegaly Skin: Skin color, texture, turgor normal. No rashes or lesions Lymph nodes: Cervical, supraclavicular, and axillary nodes  normal.  Assessment and Plan:  Restlessness and agitation Likely triggered by UTI and  husban's out of town trip.  Due to concurrent anxiety, will increase zoloft to 150 mg daily and add risperdal qhs   UTI (urinary tract infection) Empiric Ciprofloxacin x 7 days   Updated Medication List Outpatient Encounter Prescriptions as of 11/21/2013  Medication Sig  . clobetasol ointment (TEMOVATE) 0.05 % APPLY 1 APPLICATION TOPICALLY 2 (TWO) TIMES DAILY.  . Clobetasol Prop Emollient Base (CLOBETASOL PROPIONATE E) 0.05 % emollient  cream Apply to vaginal area two times daily  . sertraline (ZOLOFT) 20 MG/ML concentrated solution Take 5 mLs (100 mg total) by mouth daily.  . ciprofloxacin (CIPRO) 500 MG/5ML (10%) suspension Take 2.5 mLs (250 mg total) by mouth 2 (two) times daily.  . risperiDONE (RISPERDAL) 1 MG/ML oral solution Take 0.3 mLs (0.3 mg total) by mouth at bedtime.  . SUMAtriptan (IMITREX) 50 MG tablet Take 1 tablet (50 mg total) by mouth once as needed for migraine.  . [DISCONTINUED] ciprofloxacin (CIPRO) 250 MG tablet Take 1 tablet (250 mg total) by mouth 2 (two) times daily.  . [DISCONTINUED] ciprofloxacin (CIPRO) 250 MG tablet Take 1 tablet (250 mg total) by mouth 2 (two) times daily.  . [DISCONTINUED] simvastatin (ZOCOR) 40 MG tablet TAKE  ONE TABLET BY MOUTH EVERY EVENING     No orders of the defined types were placed in this encounter.    No Follow-up on file.

## 2013-11-22 ENCOUNTER — Telehealth: Payer: Self-pay | Admitting: Internal Medicine

## 2013-11-22 NOTE — Telephone Encounter (Signed)
Pharmacy has the Liquid on order for patient. FYI

## 2013-11-22 NOTE — Telephone Encounter (Signed)
Olegario MessierKathy,  [facility] and clarify that of the 2 cipro prescriptions I called in last night,  I want the liquid cipro filled? I dc'ed the other one

## 2013-11-23 ENCOUNTER — Telehealth: Payer: Self-pay | Admitting: *Deleted

## 2013-11-23 ENCOUNTER — Ambulatory Visit: Payer: Medicare Other

## 2013-11-23 ENCOUNTER — Other Ambulatory Visit: Payer: Medicare Other

## 2013-11-23 ENCOUNTER — Other Ambulatory Visit: Payer: Self-pay | Admitting: Internal Medicine

## 2013-11-23 DIAGNOSIS — Z23 Encounter for immunization: Secondary | ICD-10-CM

## 2013-11-23 LAB — URINALYSIS, ROUTINE W REFLEX MICROSCOPIC
Bilirubin Urine: NEGATIVE
HGB URINE DIPSTICK: NEGATIVE
Ketones, ur: NEGATIVE
NITRITE: NEGATIVE
Specific Gravity, Urine: 1.02 (ref 1.000–1.030)
Total Protein, Urine: NEGATIVE
UROBILINOGEN UA: 0.2 (ref 0.0–1.0)
Urine Glucose: NEGATIVE
pH: 6 (ref 5.0–8.0)

## 2013-11-23 MED ORDER — LORAZEPAM 0.5 MG PO TABS: 0.5000 mg | ORAL_TABLET | Freq: Three times a day (TID) | ORAL | Status: AC | PRN

## 2013-11-23 NOTE — Telephone Encounter (Signed)
Fax from pharmacy, needing PA for Lorazepam. Started online, placed in Tullo's box for completion and signature

## 2013-11-23 NOTE — Addendum Note (Signed)
Addended by: Montine CircleMALDONADO, Darivs Lunden D on: 11/23/2013 08:46 AM   Modules accepted: Orders

## 2013-11-23 NOTE — Telephone Encounter (Signed)
Placed in Red Folder. 

## 2013-11-24 NOTE — Telephone Encounter (Signed)
Filled out and returned to you

## 2013-11-25 ENCOUNTER — Other Ambulatory Visit (INDEPENDENT_AMBULATORY_CARE_PROVIDER_SITE_OTHER): Payer: Medicare Other

## 2013-11-25 ENCOUNTER — Other Ambulatory Visit: Payer: Self-pay | Admitting: Internal Medicine

## 2013-11-25 ENCOUNTER — Telehealth: Payer: Self-pay | Admitting: Internal Medicine

## 2013-11-25 DIAGNOSIS — R5383 Other fatigue: Secondary | ICD-10-CM

## 2013-11-25 DIAGNOSIS — E785 Hyperlipidemia, unspecified: Secondary | ICD-10-CM

## 2013-11-25 DIAGNOSIS — R7301 Impaired fasting glucose: Secondary | ICD-10-CM

## 2013-11-25 DIAGNOSIS — R300 Dysuria: Secondary | ICD-10-CM

## 2013-11-25 DIAGNOSIS — R5381 Other malaise: Secondary | ICD-10-CM

## 2013-11-25 LAB — CBC WITH DIFFERENTIAL/PLATELET
Basophils Absolute: 0 10*3/uL (ref 0.0–0.1)
Basophils Relative: 0.5 % (ref 0.0–3.0)
EOS PCT: 1.8 % (ref 0.0–5.0)
Eosinophils Absolute: 0.1 10*3/uL (ref 0.0–0.7)
HCT: 41.6 % (ref 36.0–46.0)
Hemoglobin: 13.6 g/dL (ref 12.0–15.0)
LYMPHS PCT: 27.6 % (ref 12.0–46.0)
Lymphs Abs: 2 10*3/uL (ref 0.7–4.0)
MCHC: 32.7 g/dL (ref 30.0–36.0)
MCV: 83.2 fl (ref 78.0–100.0)
MONO ABS: 0.5 10*3/uL (ref 0.1–1.0)
Monocytes Relative: 6.3 % (ref 3.0–12.0)
Neutro Abs: 4.6 10*3/uL (ref 1.4–7.7)
Neutrophils Relative %: 63.8 % (ref 43.0–77.0)
Platelets: 277 10*3/uL (ref 150.0–400.0)
RBC: 5.01 Mil/uL (ref 3.87–5.11)
RDW: 14.1 % (ref 11.5–15.5)
WBC: 7.1 10*3/uL (ref 4.0–10.5)

## 2013-11-25 LAB — COMPREHENSIVE METABOLIC PANEL
ALBUMIN: 3.8 g/dL (ref 3.5–5.2)
ALT: 36 U/L — ABNORMAL HIGH (ref 0–35)
AST: 37 U/L (ref 0–37)
Alkaline Phosphatase: 73 U/L (ref 39–117)
BUN: 9 mg/dL (ref 6–23)
CHLORIDE: 105 meq/L (ref 96–112)
CO2: 21 mEq/L (ref 19–32)
Calcium: 9.2 mg/dL (ref 8.4–10.5)
Creatinine, Ser: 0.8 mg/dL (ref 0.4–1.2)
GFR: 77.49 mL/min (ref >60.00)
GLUCOSE: 120 mg/dL — AB (ref 70–99)
POTASSIUM: 3.8 meq/L (ref 3.5–5.1)
Sodium: 139 mEq/L (ref 135–145)
Total Bilirubin: 1 mg/dL (ref 0.2–1.2)
Total Protein: 7.3 g/dL (ref 6.0–8.3)

## 2013-11-25 LAB — URINALYSIS, ROUTINE W REFLEX MICROSCOPIC
Bilirubin Urine: NEGATIVE
HGB URINE DIPSTICK: NEGATIVE
Ketones, ur: NEGATIVE
Leukocytes, UA: NEGATIVE
NITRITE: NEGATIVE
Total Protein, Urine: NEGATIVE
Urine Glucose: NEGATIVE
Urobilinogen, UA: 0.2 (ref 0.0–1.0)
pH: 5.5 (ref 5.0–8.0)

## 2013-11-25 LAB — LIPID PANEL
Cholesterol: 260 mg/dL — ABNORMAL HIGH (ref 0–200)
HDL: 31.5 mg/dL — ABNORMAL LOW (ref >39.00)
LDL Cholesterol: 201 mg/dL — ABNORMAL HIGH (ref 0–99)
NonHDL: 228.5
Total CHOL/HDL Ratio: 8
Triglycerides: 138 mg/dL (ref 0.0–149.0)
VLDL: 27.6 mg/dL (ref 0.0–40.0)

## 2013-11-25 LAB — POCT URINALYSIS DIPSTICK
BILIRUBIN UA: NEGATIVE
Blood, UA: NEGATIVE
Glucose, UA: NEGATIVE
Leukocytes, UA: NEGATIVE
NITRITE UA: NEGATIVE
Protein, UA: NEGATIVE
Spec Grav, UA: >=1.03
Urobilinogen, UA: 0.2
pH, UA: 5.5

## 2013-11-25 LAB — TSH: TSH: 0.64 u[IU]/mL (ref 0.35–4.50)

## 2013-11-25 NOTE — Telephone Encounter (Signed)
Pt husband request that Dr. Darrick Huntsmanullo call him on cell phone. He has some concerns that he will like to discuss/msn

## 2013-11-25 NOTE — Telephone Encounter (Signed)
Patient coming in today for labs ordered in Sept.  I have ordered repeat urine studies if she can void.  patient has dementia. If can't void,  Five caregiver specimen cup to bring back.

## 2013-11-25 NOTE — Telephone Encounter (Signed)
Patient notified MD not in office stated he received a message from MD this morning.

## 2013-11-25 NOTE — Telephone Encounter (Signed)
PA faxed as requested

## 2013-11-25 NOTE — Telephone Encounter (Signed)
She was able to leave a urine sample

## 2013-11-27 LAB — CULTURE, URINE COMPREHENSIVE

## 2013-11-27 LAB — URINE CULTURE
COLONY COUNT: NO GROWTH
ORGANISM ID, BACTERIA: NO GROWTH

## 2013-11-28 ENCOUNTER — Other Ambulatory Visit: Payer: Self-pay | Admitting: Internal Medicine

## 2013-11-28 ENCOUNTER — Encounter: Payer: Self-pay | Admitting: *Deleted

## 2013-11-28 DIAGNOSIS — F0391 Unspecified dementia with behavioral disturbance: Secondary | ICD-10-CM

## 2013-11-28 DIAGNOSIS — F03918 Unspecified dementia, unspecified severity, with other behavioral disturbance: Secondary | ICD-10-CM

## 2013-12-06 ENCOUNTER — Other Ambulatory Visit: Payer: Self-pay | Admitting: Internal Medicine

## 2013-12-06 DIAGNOSIS — G309 Alzheimer's disease, unspecified: Principal | ICD-10-CM

## 2013-12-06 DIAGNOSIS — F0280 Dementia in other diseases classified elsewhere without behavioral disturbance: Secondary | ICD-10-CM

## 2014-01-01 ENCOUNTER — Emergency Department: Payer: Self-pay | Admitting: Student

## 2014-01-01 LAB — COMPREHENSIVE METABOLIC PANEL
Albumin: 4.5 g/dL (ref 3.4–5.0)
Alkaline Phosphatase: 102 U/L
Anion Gap: 6 — ABNORMAL LOW (ref 7–16)
BUN: 12 mg/dL (ref 7–18)
Bilirubin,Total: 0.5 mg/dL (ref 0.2–1.0)
CREATININE: 0.74 mg/dL (ref 0.60–1.30)
Calcium, Total: 8.7 mg/dL (ref 8.5–10.1)
Chloride: 107 mmol/L (ref 98–107)
Co2: 30 mmol/L (ref 21–32)
EGFR (African American): >60
EGFR (Non-African Amer.): >60
GLUCOSE: 135 mg/dL — AB (ref 65–99)
Osmolality: 287 (ref 275–301)
POTASSIUM: 3.6 mmol/L (ref 3.5–5.1)
SGOT(AST): 37 U/L (ref 15–37)
SGPT (ALT): 43 U/L
Sodium: 143 mmol/L (ref 136–145)
Total Protein: 8.3 g/dL — ABNORMAL HIGH (ref 6.4–8.2)

## 2014-01-01 LAB — DRUG SCREEN, URINE

## 2014-01-01 LAB — URINALYSIS, COMPLETE
BACTERIA: NONE SEEN
BLOOD: NEGATIVE
Bilirubin,UR: NEGATIVE
Glucose,UR: NEGATIVE mg/dL (ref 0–75)
Ketone: NEGATIVE
Nitrite: NEGATIVE
Ph: 7 (ref 4.5–8.0)
Protein: NEGATIVE
RBC,UR: 1 /HPF (ref 0–5)
Specific Gravity: 1.019 (ref 1.003–1.030)
Squamous Epithelial: 2

## 2014-01-01 LAB — SALICYLATE LEVEL

## 2014-01-01 LAB — CBC
HCT: 47.4 % — ABNORMAL HIGH (ref 35.0–47.0)
HGB: 15.4 g/dL (ref 12.0–16.0)
MCH: 27.7 pg (ref 26.0–34.0)
MCHC: 32.5 g/dL (ref 32.0–36.0)
MCV: 85 fL (ref 80–100)
PLATELETS: 314 10*3/uL (ref 150–440)
RBC: 5.56 10*6/uL — ABNORMAL HIGH (ref 3.80–5.20)
RDW: 14.3 % (ref 11.5–14.5)
WBC: 9.3 10*3/uL (ref 3.6–11.0)

## 2014-01-01 LAB — TSH: THYROID STIMULATING HORM: 2.02 u[IU]/mL

## 2014-01-01 LAB — ACETAMINOPHEN LEVEL

## 2014-01-01 LAB — ETHANOL

## 2014-01-18 ENCOUNTER — Telehealth: Payer: Self-pay | Admitting: Internal Medicine

## 2014-01-18 NOTE — Telephone Encounter (Signed)
Called Mr. Lorraine Andrade and [facility] DPR is out of town will pick up when he comes back from trip.

## 2014-01-18 NOTE — Telephone Encounter (Signed)
In order to fill out the FL2accurately we need her most recent medication list updated,.  I recall that Dr chen has added medications Lorraine Burnhat are not on her list and stopped others since her last OV and we need to update her chart and [facility]  [facility]

## 2014-01-24 NOTE — Telephone Encounter (Signed)
Placed copy to chart and patient notified to pick up.

## 2014-01-26 ENCOUNTER — Emergency Department: Payer: Self-pay | Admitting: Emergency Medicine

## 2014-01-26 LAB — TROPONIN I

## 2014-01-26 LAB — COMPREHENSIVE METABOLIC PANEL
AST: 29 U/L (ref 15–37)
Albumin: 3.9 g/dL (ref 3.4–5.0)
Alkaline Phosphatase: 90 U/L
Anion Gap: 10 (ref 7–16)
BILIRUBIN TOTAL: 0.5 mg/dL (ref 0.2–1.0)
BUN: 11 mg/dL (ref 7–18)
CHLORIDE: 108 mmol/L — AB (ref 98–107)
Calcium, Total: 8.9 mg/dL (ref 8.5–10.1)
Co2: 24 mmol/L (ref 21–32)
Creatinine: 0.76 mg/dL (ref 0.60–1.30)
EGFR (African American): >60
EGFR (Non-African Amer.): >60
Glucose: 102 mg/dL — ABNORMAL HIGH (ref 65–99)
OSMOLALITY: 283 (ref 275–301)
POTASSIUM: 3.4 mmol/L — AB (ref 3.5–5.1)
SGPT (ALT): 38 U/L
Sodium: 142 mmol/L (ref 136–145)
Total Protein: 7.1 g/dL (ref 6.4–8.2)

## 2014-01-26 LAB — URINALYSIS, COMPLETE
BLOOD: NEGATIVE
Bilirubin,UR: NEGATIVE
Glucose,UR: NEGATIVE mg/dL (ref 0–75)
Ketone: NEGATIVE
NITRITE: NEGATIVE
Ph: 6 (ref 4.5–8.0)
Protein: NEGATIVE
RBC,UR: 1 /HPF (ref 0–5)
Specific Gravity: 1.023 (ref 1.003–1.030)
WBC UR: 3 /HPF (ref 0–5)

## 2014-01-26 LAB — CBC
HCT: 43.5 % (ref 35.0–47.0)
HGB: 13.8 g/dL (ref 12.0–16.0)
MCH: 27.2 pg (ref 26.0–34.0)
MCHC: 31.8 g/dL — ABNORMAL LOW (ref 32.0–36.0)
MCV: 85 fL (ref 80–100)
PLATELETS: 317 10*3/uL (ref 150–440)
RBC: 5.09 10*6/uL (ref 3.80–5.20)
RDW: 14.2 % (ref 11.5–14.5)
WBC: 9.7 10*3/uL (ref 3.6–11.0)

## 2014-01-26 LAB — VALPROIC ACID LEVEL: VALPROIC ACID: 69 ug/mL

## 2014-01-27 ENCOUNTER — Other Ambulatory Visit: Payer: Self-pay | Admitting: Internal Medicine

## 2014-03-11 ENCOUNTER — Observation Stay: Payer: Self-pay | Admitting: Internal Medicine

## 2014-03-11 ENCOUNTER — Emergency Department: Payer: Self-pay | Admitting: Emergency Medicine

## 2014-03-11 LAB — TROPONIN I: TROPONIN-I: 0.03 ng/mL

## 2014-03-11 LAB — URINALYSIS, COMPLETE
BACTERIA: NONE SEEN
Bilirubin,UR: NEGATIVE
Blood: NEGATIVE
GLUCOSE, UR: NEGATIVE mg/dL (ref 0–75)
Ketone: NEGATIVE
Leukocyte Esterase: NEGATIVE
NITRITE: NEGATIVE
PH: 7 (ref 4.5–8.0)
Protein: 30
RBC,UR: <1 /HPF (ref 0–5)
SPECIFIC GRAVITY: 1.015 (ref 1.003–1.030)
WBC UR: 2 /HPF (ref 0–5)

## 2014-03-11 LAB — COMPREHENSIVE METABOLIC PANEL
Albumin: 4 g/dL (ref 3.4–5.0)
Alkaline Phosphatase: 90 U/L (ref 46–116)
Anion Gap: 10 (ref 7–16)
BILIRUBIN TOTAL: 0.5 mg/dL (ref 0.2–1.0)
BUN: 10 mg/dL (ref 7–18)
CREATININE: 0.85 mg/dL (ref 0.60–1.30)
Calcium, Total: 9.3 mg/dL (ref 8.5–10.1)
Chloride: 107 mmol/L (ref 98–107)
Co2: 24 mmol/L (ref 21–32)
EGFR (African American): >60
EGFR (Non-African Amer.): >60
Glucose: 89 mg/dL (ref 65–99)
OSMOLALITY: 280 (ref 275–301)
Potassium: 4.1 mmol/L (ref 3.5–5.1)
SGOT(AST): 41 U/L — ABNORMAL HIGH (ref 15–37)
SGPT (ALT): 40 U/L (ref 14–63)
SODIUM: 141 mmol/L (ref 136–145)
Total Protein: 7.5 g/dL (ref 6.4–8.2)

## 2014-03-11 LAB — CBC
HCT: 43.8 % (ref 35.0–47.0)
HGB: 14.6 g/dL (ref 12.0–16.0)
MCH: 27.7 pg (ref 26.0–34.0)
MCHC: 33.2 g/dL (ref 32.0–36.0)
MCV: 84 fL (ref 80–100)
Platelet: 362 10*3/uL (ref 150–440)
RBC: 5.25 10*6/uL — AB (ref 3.80–5.20)
RDW: 13.6 % (ref 11.5–14.5)
WBC: 14.2 10*3/uL — AB (ref 3.6–11.0)

## 2014-03-12 LAB — BASIC METABOLIC PANEL
Anion Gap: 6 — ABNORMAL LOW (ref 7–16)
BUN: 9 mg/dL (ref 7–18)
CALCIUM: 8.8 mg/dL (ref 8.5–10.1)
CHLORIDE: 108 mmol/L — AB (ref 98–107)
CO2: 26 mmol/L (ref 21–32)
Creatinine: 0.78 mg/dL (ref 0.60–1.30)
EGFR (African American): >60
EGFR (Non-African Amer.): >60
GLUCOSE: 110 mg/dL — AB (ref 65–99)
OSMOLALITY: 279 (ref 275–301)
POTASSIUM: 3.6 mmol/L (ref 3.5–5.1)
Sodium: 140 mmol/L (ref 136–145)

## 2014-03-12 LAB — CBC WITH DIFFERENTIAL/PLATELET
BASOS ABS: 0.1 10*3/uL (ref 0.0–0.1)
Basophil %: 0.9 %
EOS ABS: 0.2 10*3/uL (ref 0.0–0.7)
Eosinophil %: 2.2 %
HCT: 38.7 % (ref 35.0–47.0)
HGB: 12.6 g/dL (ref 12.0–16.0)
LYMPHS ABS: 2.1 10*3/uL (ref 1.0–3.6)
Lymphocyte %: 19.7 %
MCH: 27.4 pg (ref 26.0–34.0)
MCHC: 32.6 g/dL (ref 32.0–36.0)
MCV: 84 fL (ref 80–100)
MONOS PCT: 7 %
Monocyte #: 0.8 x10 3/mm (ref 0.2–0.9)
Neutrophil #: 7.5 10*3/uL — ABNORMAL HIGH (ref 1.4–6.5)
Neutrophil %: 70.2 %
PLATELETS: 298 10*3/uL (ref 150–440)
RBC: 4.61 10*6/uL (ref 3.80–5.20)
RDW: 13.4 % (ref 11.5–14.5)
WBC: 10.8 10*3/uL (ref 3.6–11.0)

## 2014-03-27 ENCOUNTER — Emergency Department: Payer: Self-pay | Admitting: Emergency Medicine

## 2014-04-01 ENCOUNTER — Other Ambulatory Visit: Payer: Self-pay

## 2014-04-01 DIAGNOSIS — R000 Tachycardia, unspecified: Secondary | ICD-10-CM | POA: Diagnosis not present

## 2014-06-11 NOTE — Consult Note (Signed)
PATIENT NAME:  Lorraine Andrade, Lorraine Andrade MR#:  [id number] DATE OF BIRTH:  25-Jun-1950  DATE OF CONSULTATION:  03/12/2014  REASON FOR CONSULTATION: Seizure-like activity.  HISTORY OF PRESENT ILLNESS: Ms. Lorraine Andrade is a 64 year old female, who has history of severe dementia at baseline and resides in a group home, and this has been going on for almost 5 years, resides at [facility], brought in by family secondary to witnessed seizure-like activity, described a tonic/clonic activity, at which point, the patient was postictal. As per family members, specifically her daughter and husband at bedside, the patient was admitted to behavioral center about 6 weeks ago, at which time she was hospitalized for 3 weeks for adjusting her antipsychotic medications. She was recently discharged about 3 weeks ago.   Review of her medications, the patient is on Haldol, mirtazapine, and trazodone daily, all of which lower the seizure threshold. The patient is currently close back to her baseline. No tongue biting, no urinary incontinence, first-time seizure.   PAST MEDICAL HISTORY: Reviewed. Severe dementia, history of migraine headaches.   PAST SURGICAL HISTORY: Hysterectomy, appendectomy.   MEDICATIONS: At the facility have been reviewed as well.    REVIEW OF SYSTEMS: Unable to obtain due to patient's dementia.   SOCIAL HISTORY: Lives at [facility]. Needs daily assistance. Able to ambulate independently.   NEUROLOGIC: The patient is able to tell me her name, unable to tell me the date and location. Speech appears to be fluent. Extraocular movements are intact. Facial sensation is intact. Facial motor is intact. Tongue is midline. Uvula elevates symmetrically. No focal motor deficits found on either side. Sensation appears to be intact. Reflexes diminished. Coordination intact. Gait not assessed.   IMPRESSION: A 64 year old female with first-time seizure, admitted from [facility], status post CAT scan of the head: No  acute intracranial abnormality. The patient is currently seizure-free.   PLAN: Significant time was spent reviewing medications with the family. I agree with putting the patient on Dilantin 300 mg nightly, as the patient will likely benefit, due to lowering seizure threshold from the antipsychotics.   Once she is in the facility, the patient needs psychiatric evaluation. Would consider discontinuing or lowering the doses, specifically the Haldol, which is a typical antipsychotic. Mirtazapine and trazodone all lower seizure threshold. Would also consider using atypical antipsychotics. This case was discussed with family at bedside. The patient can be seen as outpatient. She is on Dilantin 300 mg nightly. I did not think that Keppra is a good medication due to her history of psychiatric comorbidities.   Thank you. It was a pleasure seeing this patient. Please call me questions  ____________________________ Pauletta BrownsYuriy Davy Faught, MD yz:ap D: 03/12/2014 13:39:42 ET T: 03/12/2014 15:43:39 ET JOB#: [id number]  cc: Pauletta BrownsYuriy Jarman Litton, MD, <Dictator> Pauletta BrownsYURIY Korayma Hagwood MD ELECTRONICALLY SIGNED 03/21/2014 12:07

## 2014-06-11 NOTE — H&P (Signed)
PATIENT NAME:  Lorraine Andrade, Lorraine Andrade MR#:  [id number] DATE OF BIRTH:  06/25/1950  DATE OF ADMISSION:  03/11/2014  ADMITTING PHYSICIAN: Enid Baas, MD   PRIMARY CARE PHYSICIAN: [facility]   CHIEF COMPLAINT: Seizure-like activity.   HISTORY OF PRESENT ILLNESS: Ms. Bai is a 64 year old female, who has a history of severe dementia at baseline, for almost 5 years now, who is a resident at [facility], was brought in by family secondary to witnessed seizure episode happen today. The patient is alert, not following commands, not able to answer simple questions, which seems to be her baseline, so most of the history is obtained from her husband and daughter at bedside. According to husband, the patient had severe behavioral disturbance issues from her dementia about 6 weeks ago, for which she was admitted to [facility], and was hospitalized for almost 3 weeks while they adjusting her antipsychotics. So, 3 weeks ago, she was discharged to [facility] and has been doing relatively well. She is usually ambulatory, does not use a cane or walker. No history of any falls. This morning, she was noted to be in her shower and was found on the floor. Nobody witnessed the fall, so they did not know if she actually had a seizure at the time. The patient does not have any history of seizures, but according to her son, she was put on multiple new antipsychotics, which decrease his seizure threshold. However, family does not want to change any of her antipsychotics unless it is really needed because she had significant behavioral disturbances that were controlled with these medications. Anyway, she was presented to the ER after her first fall. They did a CT of her head, neuro checks. CT did not show any acute changes. She was discharged back to [facility]. Her husband went to check on her after she came back to facility, took her for lunch, and while patient was eating, she had  weakness, seizure, she was jerking all four extremities, eyes rolled back, decreased, respirations, and unconscious for almost 10 minutes according to the husband, so, she was brought back. Repeat CT does not show any acute findings. After the first fall, she did have a scalp laceration, for which she staples on the back of her head. According to family, the patient has severe dementia, does not recognize family members, does not maintain a conversation, mostly nonverbal, but  follow some simple commands when asked to eat. This has been changed since the fall and seizure today. So, she is being admitted under observation for new onset seizure.   PAST MEDICAL HISTORY:  1. Severe dementia.  2. History of migraine headaches.   PAST SURGICAL HISTORY:  1. Hysterectomy. 2. Appendectomy.   ALLERGIES TO MEDICATIONS: ARICEPT AND SULFA DRUGS.  CURRENT HOME MEDICATIONS: 1. Clobetasol topical 0.05%- apply vaginally every other day for itching.  2. Docusate 10 mL liquid every other day.  3. Geri-Lanta 200 mg/200/mg/20 mg per 5 mL oral suspension liquid every 6 hours as needed for indigestion and heartburn.  4. Haldol 2 mg p.o. b.i.d.  5. Haldol 2 mg 1 tablet q.4 hours p.r.n. for psychosis symptoms.  6. Hydroxyzine 50 mg p.o. 3 times a day.  7. Hydroxyzine 25 mg every 4 hours as needed for anxiety.  8. Loperamide 3 mg every 3 hours as needed for loose stools.  9. Ativan 0.25 mg every 6 hours as needed for anxiety.  10. Tylenol 500 mg q.4 hours p.r.n. for fever.  11. Milk of magnesia 30 mL daily p.r.n. for constipation.  12. Mirtazapine 15 mg at bedtime.  13. Polyethylene glycol p.r.n. daily for constipation.  14. Potassium chloride 10 mEq p.o. b.i.d.  15. Q-Tussin 10 mL q. 6 hours p.r.n. for cough.  16. Temazepam 30 mg p.o. daily.  17. Trazodone 50 mg p.o. at bedtime.  18. Trazodone 50 mg p.o. additional at bedtime as needed if trouble sleeping.  19. Vitamin D3 1000 international units p.o. daily.    SOCIAL HISTORY: Lives at [facility], needs assistance with feeding, but ambulatory, and dependent. Needs meds to be crushed in applesauce or yogurt. No smoking or alcohol use.   FAMILY HISTORY: Not relevant at this time.   REVIEW OF SYSTEMS: Difficult to be obtained secondary to patient's severe dementia.   PHYSICAL EXAMINATION:  VITAL SIGNS: Temperature 98.8 degrees Fahrenheit, pulse 78, respirations 18, blood pressure 166 over to 70, pulse oximetry 99% on room air.  GENERAL: Well-built, well-nourished female sitting in bed, not in any acute distress.  HEENT: Normocephalic, atraumatic. Pupils equal and reacting to light. Anicteric sclerae. Extraocular movements intact.  OROPHARYNX: Clear without erythema, mass or exudates.  NECK: Supple. No thyromegaly, JVD or carotid bruits. No lymphadenopathy.  LUNGS: Moving air bilaterally. No wheeze or crackles. No use of accessory muscles for breathing.  CARDIOVASCULAR: S1, S2, regular rate and rhythm. No murmurs, rubs, or gallops.  ABDOMEN: Soft, nontender, nondistended. No hepatosplenomegaly. Normal bowel sounds.  EXTREMITIES: No pedal edema. No clubbing or cyanosis, 2+ dorsalis pedis pulses palpable bilaterally.  SKIN: No acne, rash or lesions.  LYMPHATICS: No cervical lymphadenopathy.  NEUROLOGIC: The patient is able to move all 4 extremities but not following commands. Not answering simple questions.  PSYCHOLOGIC: The patient is alert, but not oriented.   LABORATORY DATA:  WBC 14.2, hemoglobin 14.6, hematocrit 43.8, platelet count 362,000.   Sodium 141, potassium 4.1, chloride 107, bicarbonate 24, BUN 10, creatinine 0.85, glucose 89, and calcium of 9.3.   Albumin is 4.0, ALT 40, AST 41, alkaline phosphatase 90, total bilirubin 0.5. Troponin is 0.03. Urinalysis negative for any infection. Chest x-ray: Clear lung fields. No acute cardiopulmonary disease and CT of the head showing stable scan. No bleed or other acute intracranial process  noted. Mild diffuse atrophy seen. EKG showing sinus tachycardia, heart rate of 108, no acute ST-T wave abnormalities.   ASSESSMENT AND PLAN: A 64 year old female with severe dementia, admitted from [facility] after a fall, which was unwitnessed. CT at the time was negative. The patient was discharged back, comes again with new onset seizure.   1. Seizure, new onset, not sure if triggered by the fall or new recent antipsychotics, which were changed about 6 weeks ago. CT of the head has been negative twice. She appears to be pretty much close to her baseline, other than a little more confused and not following commands according to family. Continue to monitor, neuro checks, neurology consult. EEG has been on ordered.  Hold off on starting new antiepileptics.  2. Severe dementia with behavioral disturbances. Continue her home medications. Medications were changed about 6 weeks ago. She had an inpatient hospital stay at [facility] for [facility] weeks, so family does not want to change any antipsychotics unless absolutely needed, and at that time, they would need to have psychiatrics on board. For now, we will continue her current medications. 3. Leukocytosis from fall, stress reaction.  4. Deep vein thrombosis prophylaxis.   CODE STATUS: DNR per POA.  TIME SPENT ON ADMISSION: 50 minutes.   ____________________________ Enid Baas, MD rk:ap D: 03/11/2014 16:04:01 ET T: 03/11/2014 16:32:15 ET JOB#: [id number]  cc: Enid Baas, MD, <Dictator> Enid Baas MD ELECTRONICALLY SIGNED 03/11/2014 18:13

## 2014-06-11 NOTE — Discharge Summary (Signed)
PATIENT NAME:  Lorraine Andrade, Lorraine F MR#:  [id number] DATE OF BIRTH:  01-Sep-1950  DATE OF ADMISSION:  03/11/2014 DATE OF DISCHARGE:  03/12/2014  ADMITTING DIAGNOSIS: Fall as well as witnessed seizure.    DISCHARGE DIAGNOSES:   1.  Possible seizure.  2.  Severe dementia with initiation of multi antipsychotics recently with improvement in her behavior.  3.  History of migraine headaches.  4.  Status post hysterectomy.  5.  Status post appendectomy.   CONSULTANTS: Dr. Pauletta BrownsYuriy Zeylikman.    LABORATORY DATA:  CT scan of the head showed some atrophy, no other abnormality. Admitting laboratories, glucose 89, BUN 10, creatinine 0.85, sodium 141, potassium 4.1, chloride 107, CO2 of 24, calcium 9.3. LFTs were normal except slightly abnormal AST. Admitting WBC count 14.2, hemoglobin 14.6, platelet count was 362,000. Urinalysis was negative.   HOSPITAL COURSE: Please refer to H and P done by the admitting physician. The patient is a 64 year old white female who has advanced dementia and actually was recently hospitalized in [facility] for a few weeks.  It required multiple medication adjustments to get her behavior under control, who is currently residing in assisted living, who apparently had an unwitnessed fall. Then she was noted to have a seizure. It is unclear if initially she had a seizure. The patient was brought to the hospital. Due to her multiple antipsychotic and new  medications the seizure could have been related to that, however due to these medications being adjusted and taking 6 weeks for her behavior to improve it was decided not to change any of her medications. She was seen by consultation by Dr. Loretha BrasilZeylikman of neurology who recommended that we start her on Dilantin. The patient was discharged on Dilantin. At this time she is doing much better. No further seizure activity was noted and she is stable to return back to the assisted living.   DISCHARGE MEDICATIONS:  Clobetasol  topically 0.05 topically to vaginal area every day for itching, docusate 10 mL every other day, mirtazapine 15 at bedtime, potassium chloride 10 mg 1 tab p.o. b.i.d., temazepam 30 mg at bedtime, vitamin D3, 1000 international units daily, Geri-Lanta 30 mL q. 6 as needed for indigestion or heartburn, haloperidol 2 mg every 4 hours as needed for psychosis, hydroxyzine 25 mg q. 4 p.r.n. for anxiety, loperamide 2 mg 1 tab p.o. every 3 hours as needed for loose stools, lorazepam 0.5 mg every 6 hours as needed for anxiety, Tylenol 650 q. 4 p.r.n. for fever, milk of magnesia 30 mL at bedtime as needed for constipation, MiraLax 17 grams daily as needed for constipation, trazodone 50 mg at bedtime as needed for trouble sleeping, haloperidol 2 mg b.i.d., hydroxyzine 50 one tab p.o. t.i.d., trazodone 50 at bedtime, Dilantin 300 mg at bedtime.   DIET: Low sodium.   ACTIVITY: As tolerated.   FOLLOWUP: With [facility] in 1-2 weeks. The patient also needs further evaluation by geropsychiatrist at assisted living for further recommendations and changes to her medications.   TIME SPENT: 35 minutes.     ____________________________ Lacie ScottsShreyang H. Allena KatzPatel, MD shp:bu D: 03/13/2014 19:20:22 ET T: 03/13/2014 21:11:41 ET JOB#: [id number]  cc: Maverick Dieudonne H. Allena KatzPatel, MD, <Dictator> Charise CarwinSHREYANG H Amiree No MD ELECTRONICALLY SIGNED 03/16/2014 12:35

## 2014-07-20 ENCOUNTER — Encounter: Payer: Self-pay | Admitting: *Deleted

## 2014-07-20 ENCOUNTER — Emergency Department
Admission: EM | Admit: 2014-07-20 | Discharge: 2014-07-20 | Disposition: A | Discharge status: Hospice | Attending: Emergency Medicine | Admitting: Emergency Medicine

## 2014-07-20 ENCOUNTER — Emergency Department

## 2014-07-20 DIAGNOSIS — Z79899 Other long term (current) drug therapy: Secondary | ICD-10-CM | POA: Insufficient documentation

## 2014-07-20 DIAGNOSIS — W010XXA Fall on same level from slipping, tripping and stumbling without subsequent striking against object, initial encounter: Secondary | ICD-10-CM | POA: Diagnosis not present

## 2014-07-20 DIAGNOSIS — S0121XA Laceration without foreign body of nose, initial encounter: Secondary | ICD-10-CM | POA: Insufficient documentation

## 2014-07-20 DIAGNOSIS — Y998 Other external cause status: Secondary | ICD-10-CM | POA: Diagnosis not present

## 2014-07-20 DIAGNOSIS — S0993XA Unspecified injury of face, initial encounter: Secondary | ICD-10-CM | POA: Diagnosis present

## 2014-07-20 DIAGNOSIS — G309 Alzheimer's disease, unspecified: Secondary | ICD-10-CM | POA: Diagnosis not present

## 2014-07-20 DIAGNOSIS — S022XXB Fracture of nasal bones, initial encounter for open fracture: Secondary | ICD-10-CM | POA: Insufficient documentation

## 2014-07-20 DIAGNOSIS — Y9289 Other specified places as the place of occurrence of the external cause: Secondary | ICD-10-CM | POA: Insufficient documentation

## 2014-07-20 DIAGNOSIS — Y9389 Activity, other specified: Secondary | ICD-10-CM | POA: Insufficient documentation

## 2014-07-20 DIAGNOSIS — F0280 Dementia in other diseases classified elsewhere without behavioral disturbance: Secondary | ICD-10-CM | POA: Diagnosis not present

## 2014-07-20 DIAGNOSIS — W19XXXA Unspecified fall, initial encounter: Secondary | ICD-10-CM

## 2014-07-20 HISTORY — DX: Dementia in other diseases classified elsewhere, unspecified severity, without behavioral disturbance, psychotic disturbance, mood disturbance, and anxiety: F02.80

## 2014-07-20 HISTORY — DX: Unspecified dementia, unspecified severity, without behavioral disturbance, psychotic disturbance, mood disturbance, and anxiety: F03.90

## 2014-07-20 HISTORY — DX: Alzheimer's disease, unspecified: G30.9

## 2014-07-20 MED ORDER — MORPHINE SULFATE 2 MG/ML IJ SOLN
2.0000 mg | Freq: Once | INTRAMUSCULAR | Status: AC
  Administered 2014-07-20: 2 mg via INTRAMUSCULAR

## 2014-07-20 MED ORDER — HYDROCODONE-ACETAMINOPHEN 7.5-325 MG/15ML PO SOLN: 10.0000 mL | Freq: Four times a day (QID) | ORAL | Status: AC | PRN

## 2014-07-20 MED ORDER — MORPHINE SULFATE 2 MG/ML IJ SOLN
INTRAMUSCULAR | Status: AC
  Filled 2014-07-20: qty 1

## 2014-07-20 NOTE — Discharge Instructions (Signed)
Please seek medical attention for any high fevers, chest pain, shortness of breath, change in behavior, persistent vomiting, bloody stool or any other new or concerning symptoms.  Nasal Fracture A nasal fracture is a break or crack in the bones of the nose. A minor break usually heals in a month. You often will receive black eyes from a nasal fracture. This is not a cause for concern. The black eyes will go away over 1 to 2 weeks.  DIAGNOSIS  Your caregiver may want to examine you if you are concerned about a fracture of the nose. X-rays of the nose may not show a nasal fracture even when one is present. Sometimes your caregiver must wait 1 to 5 days after the injury to re-check the nose for alignment and to take additional X-rays. Sometimes the caregiver must wait until the swelling has gone down. TREATMENT Minor fractures that have caused no deformity often do not require treatment. More serious fractures where bones are displaced may require surgery. This will take place after the swelling is gone. Surgery will stabilize and align the fracture. HOME CARE INSTRUCTIONS   Put ice on the injured area.  Put ice in a plastic bag.  Place a towel between your skin and the bag.  Leave the ice on for 15-20 minutes, 03-04 times a day.  Take medications as directed by your caregiver.  Only take over-the-counter or prescription medicines for pain, discomfort, or fever as directed by your caregiver.  If your nose starts bleeding, squeeze the soft parts of the nose against the center wall while you are sitting in an upright position for 10 minutes.  Contact sports should be avoided for at least 3 to 4 weeks or as directed by your caregiver. SEEK MEDICAL CARE IF:  Your pain increases or becomes severe.  You continue to have nosebleeds.  The shape of your nose does not return to normal within 5 days.  You have pus draining from the nose. SEEK IMMEDIATE MEDICAL CARE IF:   You have bleeding from  your nose that does not stop after 20 minutes of pinching the nostrils closed and keeping ice on the nose.  You have clear fluid draining from your nose.  You notice a grape-like swelling on the dividing wall between the nostrils (septum). This is a collection of blood (hematoma) that must be drained to help prevent infection.  You have difficulty moving your eyes.  You have recurrent vomiting. Document Released: 01/25/2000 Document Revised: 04/21/2011 Document Reviewed: 05/13/2010 [facility] Patient Information 2015 [place], [place]. This information is not intended to replace advice given to you by your health care provider. Make sure you discuss any questions you have with your health care provider.

## 2014-07-20 NOTE — Progress Notes (Signed)
Current hospice patient followed at [facility] who fell and has laceration to her nose.  Facility staff evaluated and sending to ED for further evaluation.   Patient had CT of head with the following results- 20mm left subdural collection with density suggesting hygroma or subacute hematoma. Nasal arch fractures with mild depression on the right.  Patient has had 2mg  morphine IM with good results.  ED physician feels transition from ED to the hospice home for symptom management of pain is a good discharge plan.  I discussed with the patients son who is in agreement.  He understands if patients symptoms are managed and no decline, Hospice home staff would assist with discharge planning.  Son is in agreement.  Patient is awake, quiet and non-responsive verbally.  Patient has the potential for decline with injuries from her fall.  Charles discussed patients current status with Dr. Waynetta Sandy who approved the transition to the hospice home.  ED staff to call report to the hospice home.

## 2014-07-20 NOTE — ED Provider Notes (Signed)
[facility] Emergency Department Provider Note  ____________________________________________  Time seen: on EMS arrival  I have reviewed the triage vital signs and the nursing notes.   HISTORY  Chief Complaint Fall   History limited by: Dementia   HPI Lorraine Andrade is a 64 y.o. female who presents from [facility] because of a fall. Per EMS description it was a mechanical fall. Patient fell forwards in her face. No report of loss of consciousness. Small amount of blood on scene. Patient herself unable to give any history.     Past Medical History  Diagnosis Date  . Hyperlipidemia   . Depression   . Migraine   . Dementia of Alzheimer's type, with early onset, with depressed mood   . Lichen sclerosus of female genitalia     Patient Active Problem List   Diagnosis Date Noted  . Restlessness and agitation 11/21/2013  . UTI (urinary tract infection) 11/21/2013  . Restless leg syndrome 06/21/2013  . Tonsil stone 06/10/2013  . Routine general medical examination at a health care facility 09/28/2012  . Lichen sclerosus of female genitalia   . Medicare annual wellness visit, subsequent 05/19/2011  . Screening for colon cancer 05/19/2011  . Dementia of Alzheimer's type, with early onset, with depressed mood 01/22/2011  . Hyperlipidemia LDL goal <100 01/22/2011  . Migraine headache 01/22/2011    Past Surgical History  Procedure Laterality Date  . Salpingectomy  1998    bilateral  . Abdominal hysterectomy  1998  . Cystocele repair  2010    [facility]    Current Outpatient Rx  Name  Route  Sig  Dispense  Refill  . gabapentin (NEURONTIN) 250 MG/5ML solution   Oral   Take 250 mg by mouth 3 (three) times daily as needed.         Marland Kitchen LORazepam (ATIVAN) 0.5 MG tablet   Oral   Take 1 tablet (0.5 mg total) by mouth every 8 (eight) hours as needed for anxiety.   90 tablet   3   . sertraline (ZOLOFT) 20 MG/ML concentrated solution    Oral   Take 5 mLs (100 mg total) by mouth daily.   150 mL   5     Allergies Review of patient's allergies indicates no known allergies.  Family History  Problem Relation Age of Onset  . Diabetes Maternal Aunt   . Multiple sclerosis Maternal Aunt   . Diabetes Maternal Uncle   . Diabetes Maternal Grandmother   . Cancer Brother     liver cancer    Social History History  Substance Use Topics  . Smoking status: Never Smoker   . Smokeless tobacco: Never Used  . Alcohol Use: No    Review of Systems Unable to obtain secondary to dementia  ____________________________________________   PHYSICAL EXAM:  Constitutional: Awake and alert. Eyes: Conjunctivae are normal. PERRL. Normal extraocular movements. ENT   Head: Normocephalic. Laceration over bridge of nose, blood in bilateral nares. No hemotympanum.   Mouth/Throat: Some dried blood in mouth. No active bleeding appreciated. No grossly loose teeth however top 2 teeth appear slightly indented.   Neck: No stridor. No tenderness to palpation. Hematological/Lymphatic/Immunilogical: No cervical lymphadenopathy. Cardiovascular: Normal rate, regular rhythm.  No murmurs, rubs, or gallops. Respiratory: Normal respiratory effort without tachypnea nor retractions. Breath sounds are clear and equal bilaterally. No wheezes/rales/rhonchi. Gastrointestinal: Soft and nontender. No distention.  Genitourinary: Deferred Musculoskeletal: Normal range of motion in all extremities. No joint effusions.  No lower  extremity tenderness nor edema. Neurologic:  Demented, nonverbal. Appears to be moving all extremities. Extraocular motions intact. Skin:  Skin is warm, dry and intact. No rash noted.   ____________________________________________    LABS (pertinent positives/negatives)  None  ____________________________________________   EKG  None  ____________________________________________    RADIOLOGY  CT head, cervical  spine, maxillofacial IMPRESSION: 1. 6mm left subdural collection with density suggesting hygroma or subacute hematoma. No significant mass effect. 2. Nasal arch fractures with mild depression on the right. 3. No evidence of cervical spine injury. 4. Brain atrophy correlating with dementia history. 5. Motion degraded study.  ____________________________________________   PROCEDURES  Procedure(s) performed: None  Critical Care performed: No  ____________________________________________   INITIAL IMPRESSION / ASSESSMENT AND PLAN / ED COURSE  Pertinent labs & imaging results that were available during my care of the patient were reviewed by me and considered in my medical decision making (see chart for details).  Patient presented to the emergency department after a fall on her face. On exam patient does have a small laceration at the bridge of her nose, blood bilateral nares and oropharynx. No obvious severe dental injury. CT head and neck and maxillofacial showed nasal arch fractures and 6 mm left subdural collection. I had a long and frank discussion with the family about this fluid collection. Family was clear that they would not want a neurosurgical intervention for this. I discussed with the family that if the fluid collection continue to grow this could lead to death. Family did verbalize understanding. After discussion with family plan will be to try to have patient stay with hospice at their facility for the next couple of days until pain control can be better. I think this is a good  plan for the patient  ____________________________________________   FINAL CLINICAL IMPRESSION(S) / ED DIAGNOSES  Final diagnoses:  Fall, initial encounter  Nasal bones, open fracture, initial encounter     Phineas Semen, MD 07/20/14 1541

## 2014-07-20 NOTE — ED Notes (Addendum)
Pt is from [facility] , pt tripped and fell,  Pt is nonverbal, has dementia, pt has a laceration to nose, mouth has a small amount of frank blood

## 2014-08-04 ENCOUNTER — Encounter
Admission: RE | Admit: 2014-08-04 | Discharge: 2014-08-04 | Disposition: A | Discharge status: Home / Self Care | Payer: Medicare Other | Source: Ambulatory Visit | Attending: Internal Medicine | Admitting: Internal Medicine

## 2014-09-11 DEATH — deceased

## 2016-05-28 IMAGING — CT CT HEAD W/O CM
4 of 13 series · 16 of 47 positions shown, 18 images · non-contrast
Comparison: 03/27/2014

CLINICAL DATA: Alzheimer's dementia with fall and nose laceration.

EXAM:
CT HEAD WITHOUT CONTRAST
CT MAXILLOFACIAL WITHOUT CONTRAST
CT CERVICAL SPINE WITHOUT CONTRAST
TECHNIQUE: Multidetector CT imaging of the head, cervical spine, and
maxillofacial structures were performed using the standard protocol
without intravenous contrast. Multiplanar CT image reconstructions
of the cervical spine and maxillofacial structures were also
generated.

[Series 4: max soft · axial · 0.35mm/px · z∈[-206,-86]mm · 5 of 92 slices shown]
[im 16/92  brain]
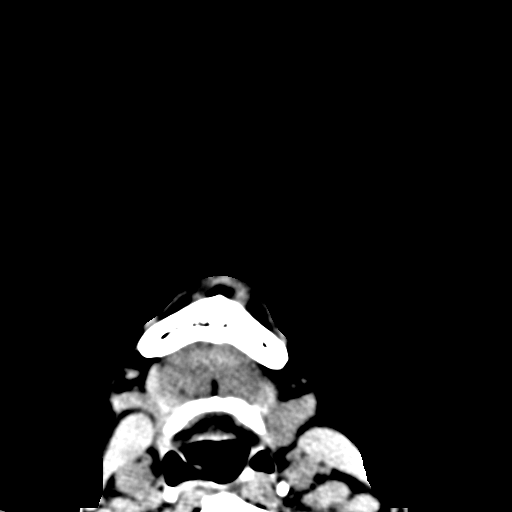
[im 31/92  brain]
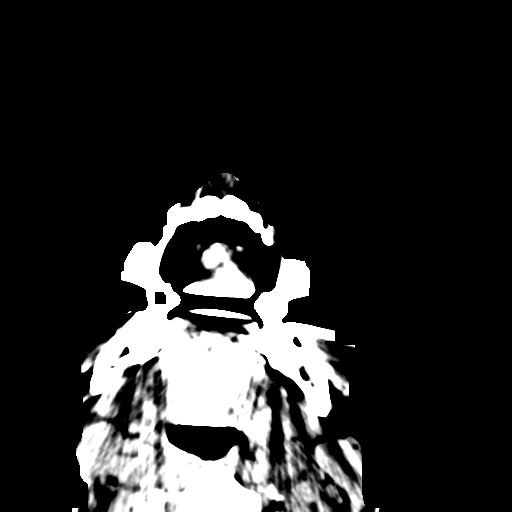
[im 46/92  brain]
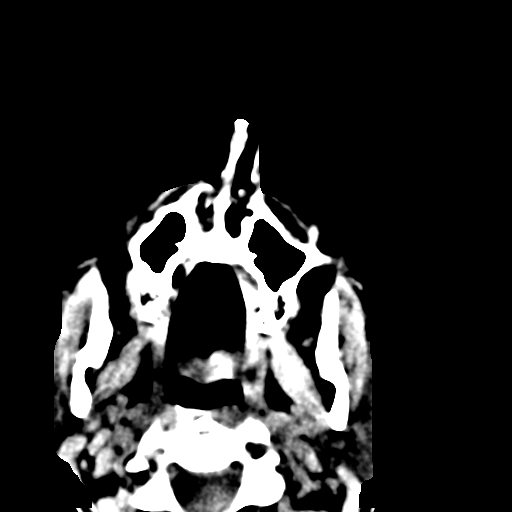
[im 61/92  brain]
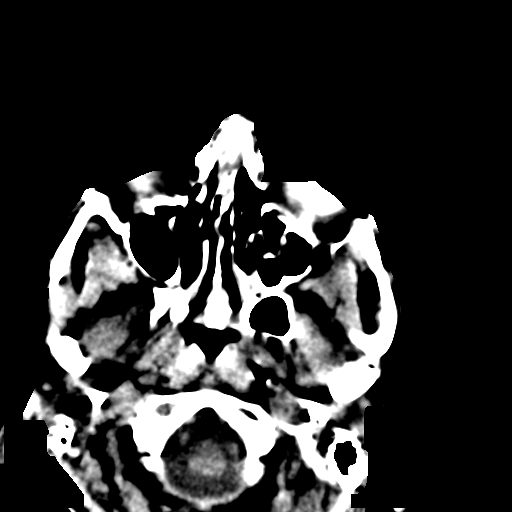
[im 76/92  brain]
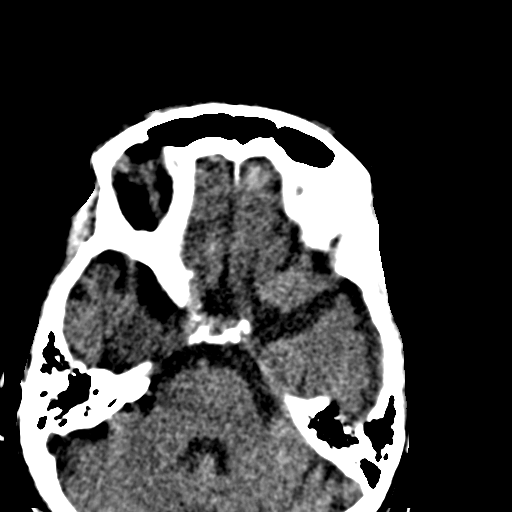

[Series 15: soft tissue · axial · 0.34mm/px · z∈[-248,-184]mm · 3 of 98 slices shown]
[im 17/98  brain]
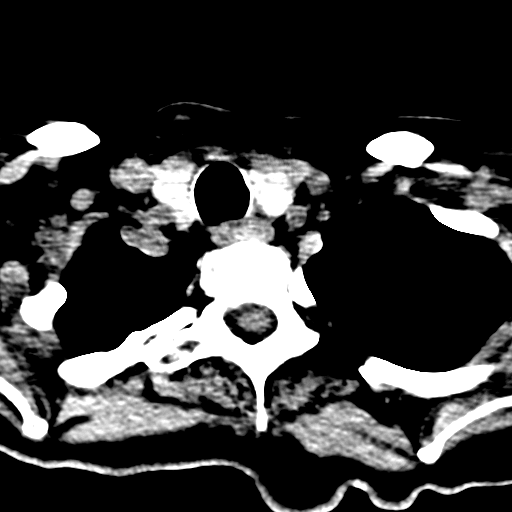
[im 33/98  brain]
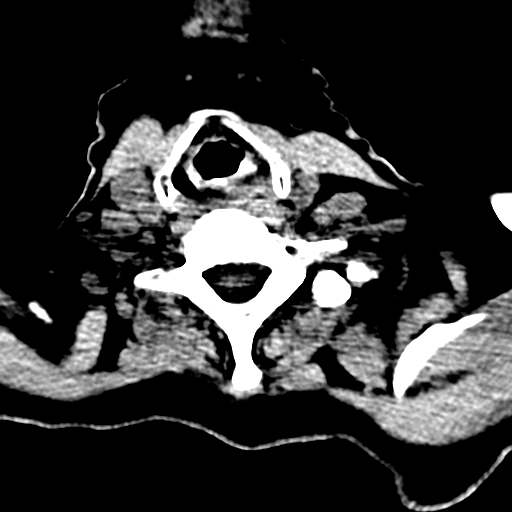
[im 49/98  brain]
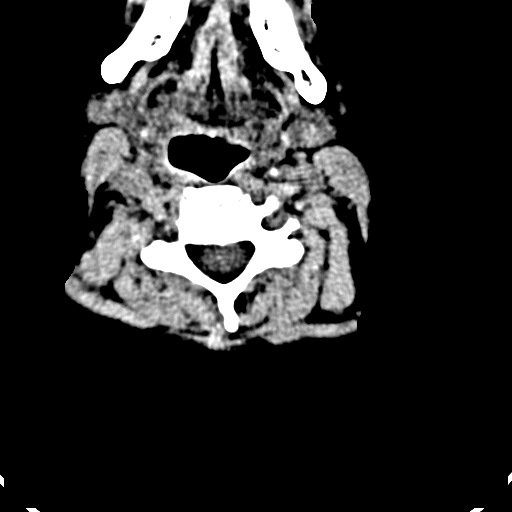

[Series 18: axial · axial · 0.31mm/px · z∈[-298,-145]mm · 6 of 115 slices shown, 8 images]
[im 17/115  brain]
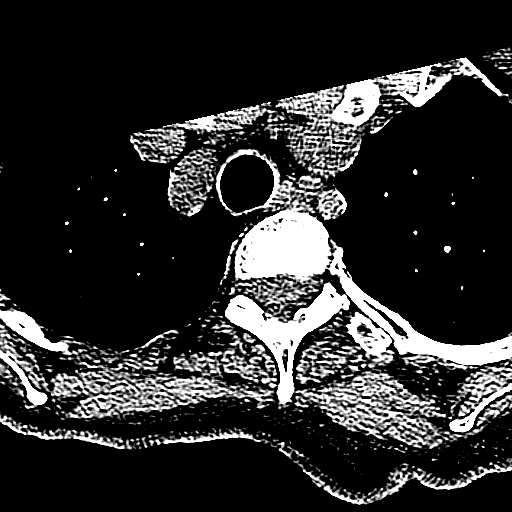
[im 17/115  bone]
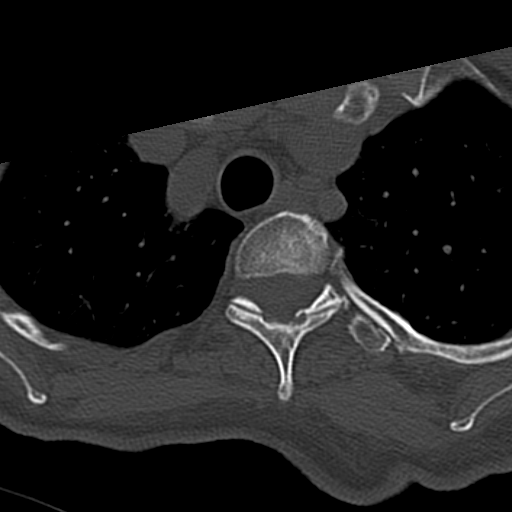
[im 33/115  brain]
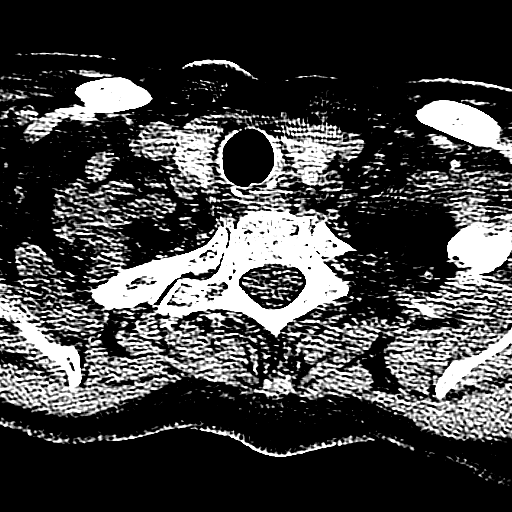
[im 49/115  brain]
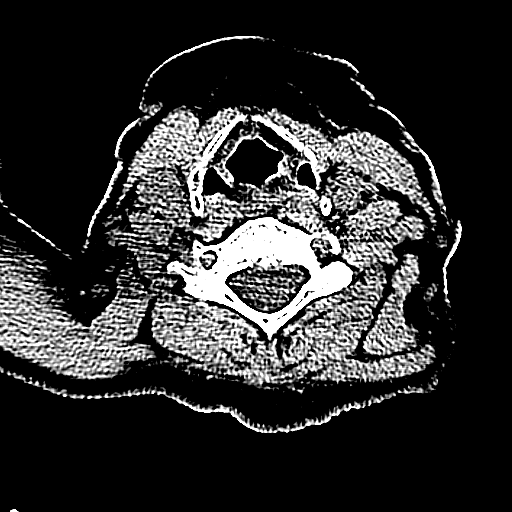
[im 66/115  brain]
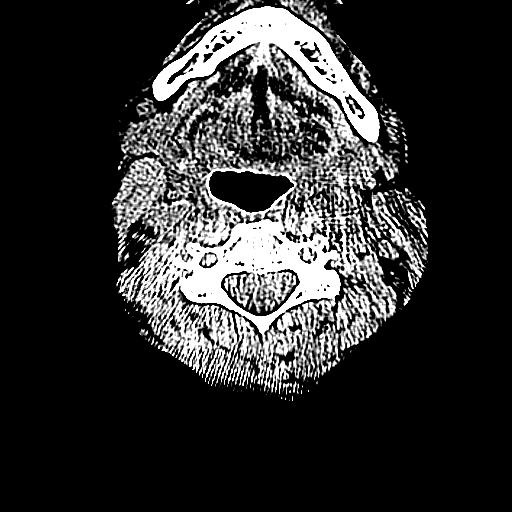
[im 82/115  brain]
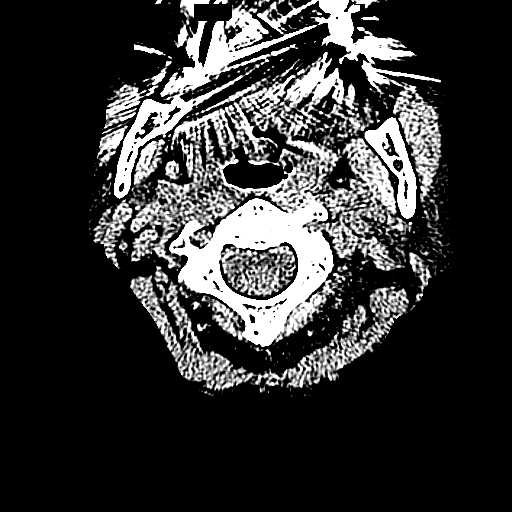
[im 82/115  bone]
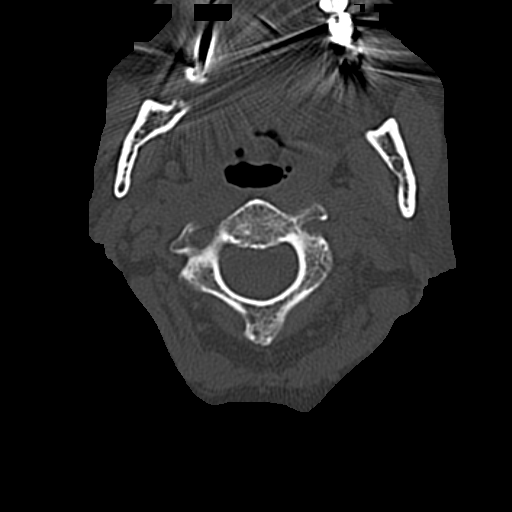
[im 98/115  brain]
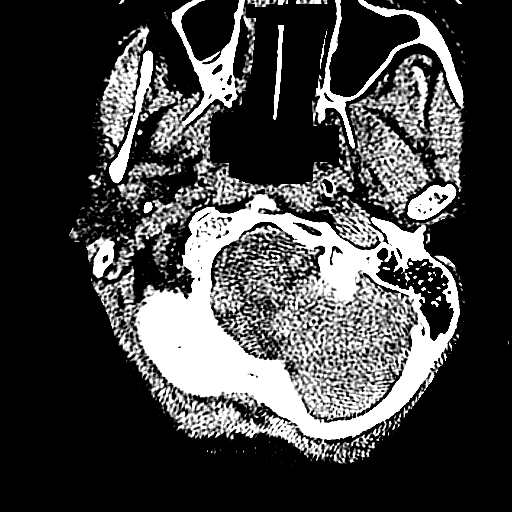

[Series 21: coronal soft · coronal · 0.30mm/px · 2 of 72 slices shown]
[im 24/72  brain]
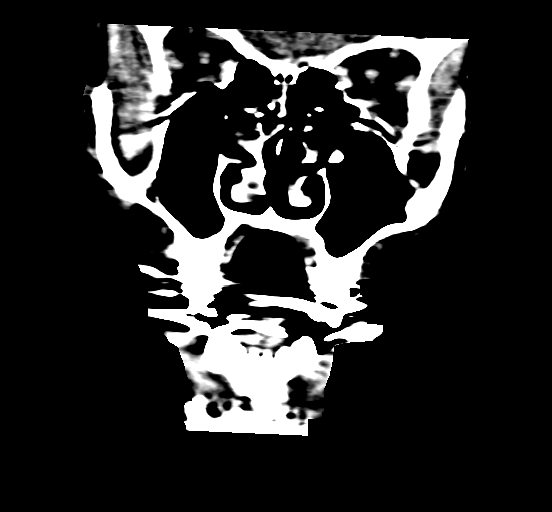
[im 48/72  brain]
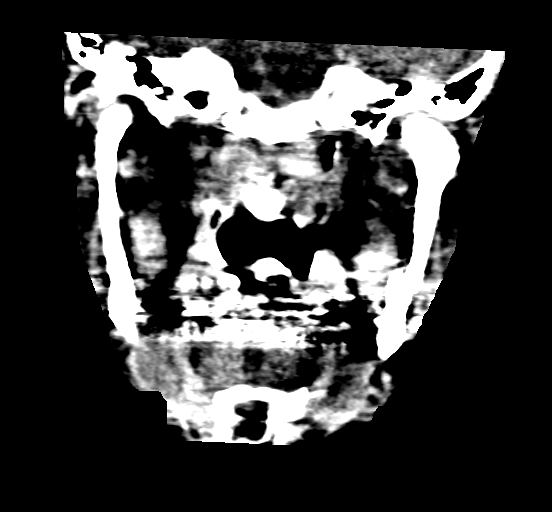

[16 of 47 positions shown; findings below may reference images not displayed]

FINDINGS: CT HEAD FINDINGS

Skull and Sinuses:Facial findings are discussed below. No calvarial
fracture.

Orbits: No traumatic findings.

Brain: New thin subdural fluid collection around the upper left
cerebral convexity which is more dense than CSF, but less dense than
pure hematoma. Maximal thickness is 6 mm and there is no mass
effect. A punctate high-density along the upper falx is stable from
prior and associated with macroscopic fat, appearance consistent
with dural metaplasia.

Brain atrophy and ex vacuo ventricular enlargement, stable from
prior and consistent with history of dementia. No evidence of acute
infarct or obstructive hydrocephalus.

CT MAXILLOFACIAL FINDINGS

Motion degraded imaging requiring repeat acquisition, which is still
motion degraded.

Bilateral nasal arch fractures with mild depression on the right.
There is associated soft tissue gas compatible with laceration. The
mandible appears intact and located. No evidence of additional
facial fracture. Minimal fluid layering within the right sphenoid
sinus, potentially from epistaxis. Small volume fluid within the air
cells of the right petrous apex without septal destruction or
expansion. No evidence of globe injury or postseptal hematoma.

CT CERVICAL SPINE FINDINGS

Mild reversal of cervical lordosis without subluxation, likely
positional. No evidence of fracture. Degenerative disc narrowing
with uncovertebral spurring mainly at C4-5 and C5-6. No gross
cervical canal hematoma or prevertebral edema.

Critical Value/emergent results were called by telephone at the time
of interpretation on 07/20/2014 at [DATE] to Dr. ANDREJUS VERSECKAITE ,
who verbally acknowledged these results.
IMPRESSION: 1. 6mm left subdural collection with density suggesting hygroma or
subacute hematoma. No significant mass effect.
2. Nasal arch fractures with mild depression on the right.
3. No evidence of cervical spine injury.
4. Brain atrophy correlating with dementia history.
5. Motion degraded study.

## 2016-11-10 NOTE — Telephone Encounter (Signed)
Error
